# Patient Record
Sex: Male | Born: 1975 | Race: White | Hispanic: No | Marital: Single | Smoking: Current every day smoker
Health system: Southern US, Community
[De-identification: ages and names within clinical notes are randomized; demographics above are authoritative.]

## PROBLEM LIST (undated history)

## (undated) DIAGNOSIS — L0291 Cutaneous abscess, unspecified: Secondary | ICD-10-CM

## (undated) DIAGNOSIS — N200 Calculus of kidney: Secondary | ICD-10-CM

---

## 1999-03-24 ENCOUNTER — Encounter: Payer: Self-pay | Admitting: Emergency Medicine

## 1999-03-24 ENCOUNTER — Emergency Department (HOSPITAL_COMMUNITY): Admission: EM | Admit: 1999-03-24 | Discharge: 1999-03-24 | Payer: Self-pay | Admitting: Emergency Medicine

## 1999-08-12 ENCOUNTER — Emergency Department (HOSPITAL_COMMUNITY): Admission: EM | Admit: 1999-08-12 | Discharge: 1999-08-12 | Payer: Self-pay | Admitting: *Deleted

## 2001-06-27 ENCOUNTER — Inpatient Hospital Stay (HOSPITAL_COMMUNITY): Admission: AC | Admit: 2001-06-27 | Discharge: 2001-06-28 | Payer: Self-pay

## 2001-06-27 ENCOUNTER — Encounter: Payer: Self-pay | Admitting: General Surgery

## 2003-02-23 ENCOUNTER — Emergency Department (HOSPITAL_COMMUNITY): Admission: EM | Admit: 2003-02-23 | Discharge: 2003-02-23 | Payer: Self-pay | Admitting: Emergency Medicine

## 2003-03-29 ENCOUNTER — Emergency Department (HOSPITAL_COMMUNITY): Admission: EM | Admit: 2003-03-29 | Discharge: 2003-03-29 | Payer: Self-pay | Admitting: Emergency Medicine

## 2004-09-11 ENCOUNTER — Emergency Department: Payer: Self-pay | Admitting: General Practice

## 2004-09-12 ENCOUNTER — Emergency Department: Payer: Self-pay | Admitting: Emergency Medicine

## 2005-02-06 ENCOUNTER — Emergency Department: Payer: Self-pay | Admitting: Emergency Medicine

## 2006-06-08 ENCOUNTER — Emergency Department: Payer: Self-pay | Admitting: Emergency Medicine

## 2006-09-30 ENCOUNTER — Emergency Department (HOSPITAL_COMMUNITY): Admission: EM | Admit: 2006-09-30 | Discharge: 2006-09-30 | Payer: Self-pay | Admitting: Emergency Medicine

## 2006-10-12 ENCOUNTER — Emergency Department: Payer: Self-pay | Admitting: Emergency Medicine

## 2007-10-28 ENCOUNTER — Emergency Department: Payer: Self-pay | Admitting: Unknown Physician Specialty

## 2007-10-28 ENCOUNTER — Other Ambulatory Visit: Payer: Self-pay

## 2008-02-06 IMAGING — CR DG ANKLE COMPLETE 3+V*L*
3 series · 3 of 3 positions shown · non-contrast
Comparison: none

CLINICAL DATA: Lateral pain after twisting injury.    
 LEFT ANKLE - 3 VIEW:

[t ankle joint ap left]
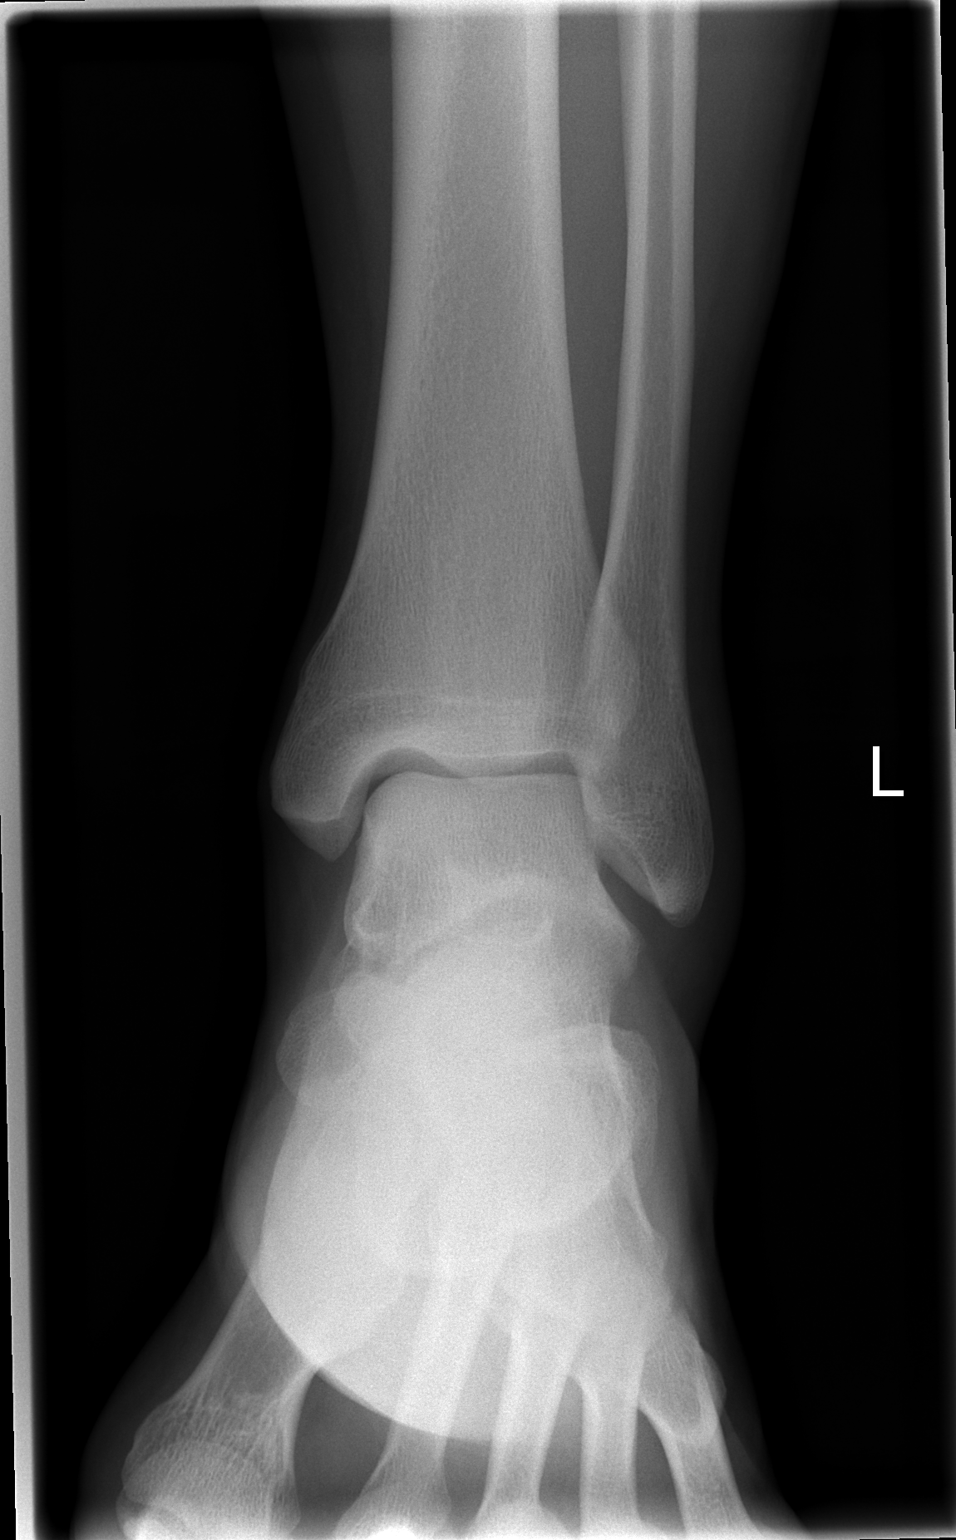

[t ankle joint oblique left]
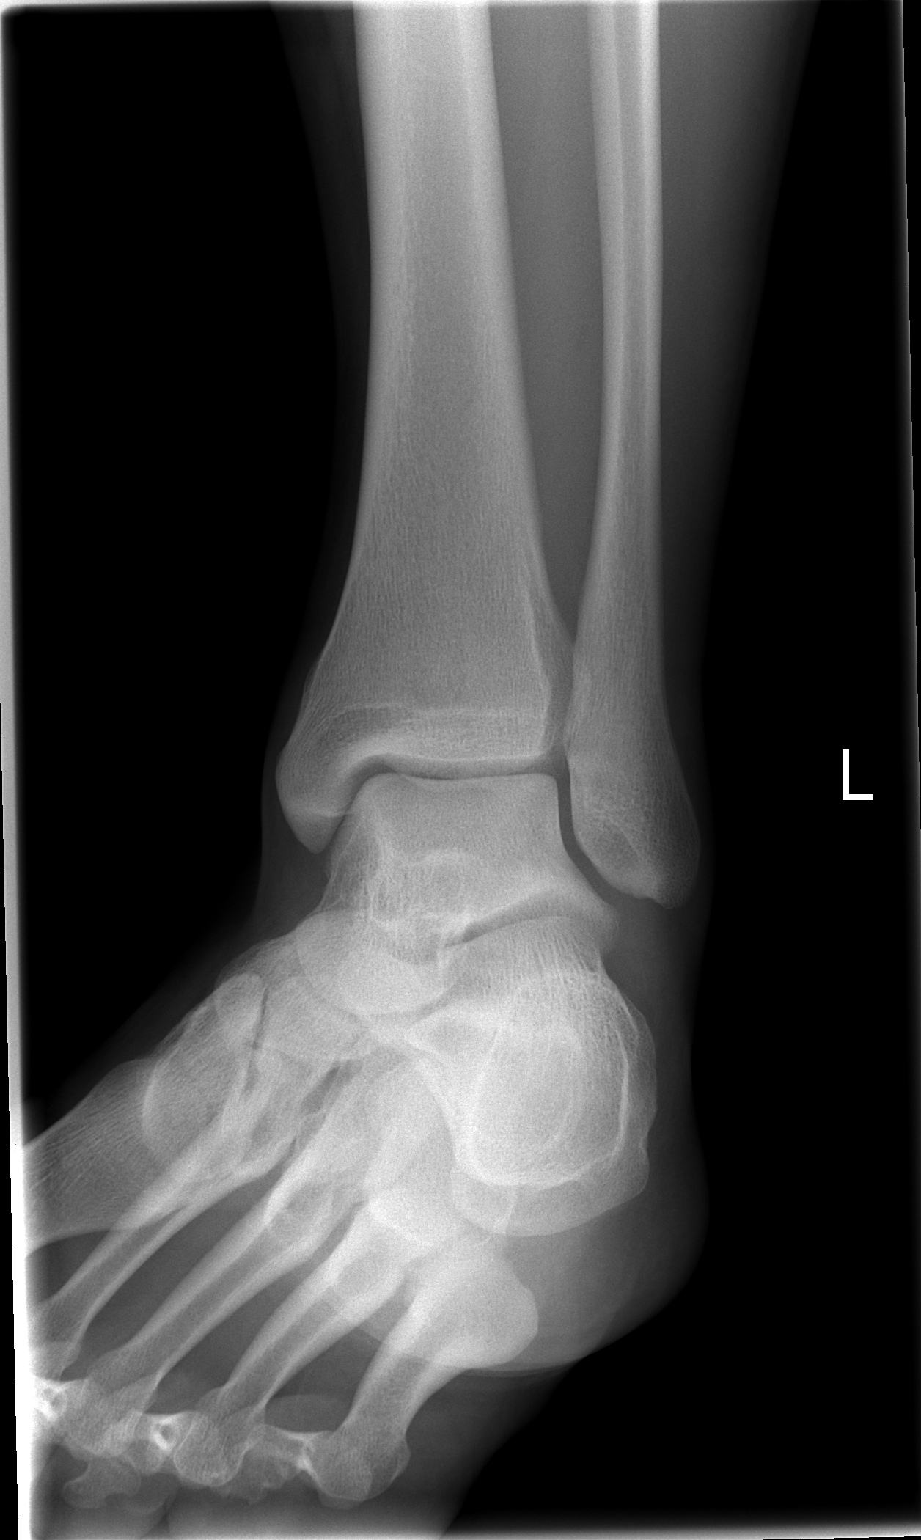

[t ankle joint lat left]
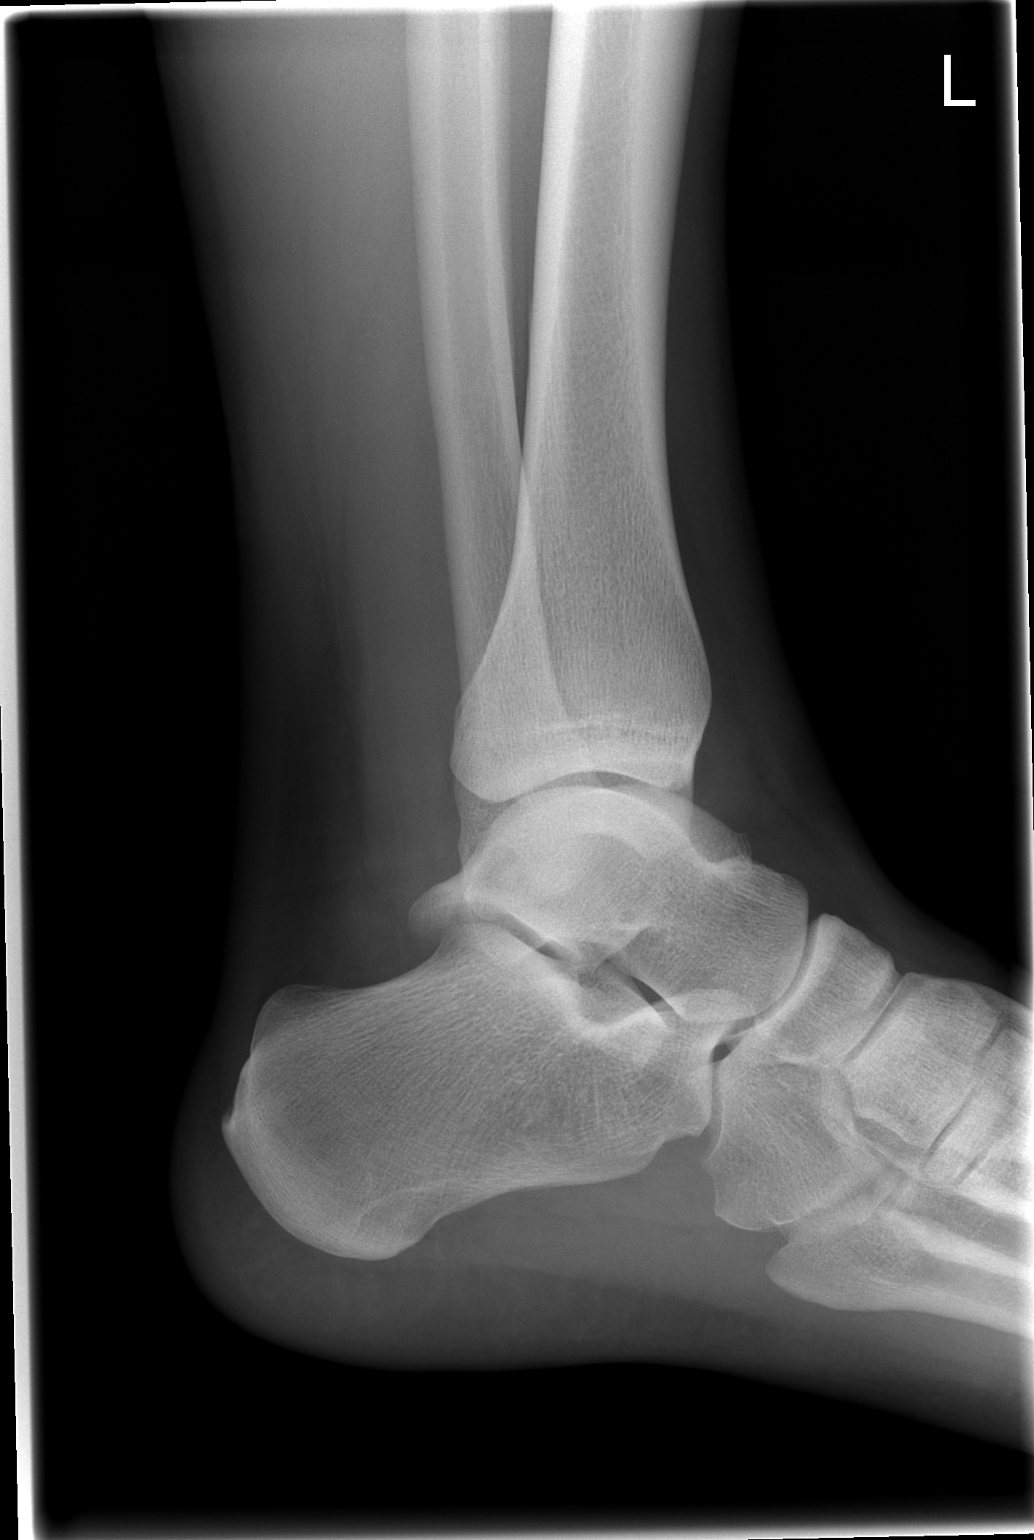

[3 of 3 positions shown; findings below may reference images not displayed]

FINDINGS: Imaged bones, joints, and soft tissues appear normal.
IMPRESSION: Negative study.

## 2008-05-15 ENCOUNTER — Emergency Department (HOSPITAL_COMMUNITY): Admission: EM | Admit: 2008-05-15 | Discharge: 2008-05-15 | Payer: Self-pay | Admitting: Emergency Medicine

## 2009-09-21 IMAGING — CR DG CHEST 2V
2 series · 2 of 2 positions shown · non-contrast
Comparison: None

CLINICAL DATA: Respiratory infection

CHEST - 2 VIEW

[w chest pa]
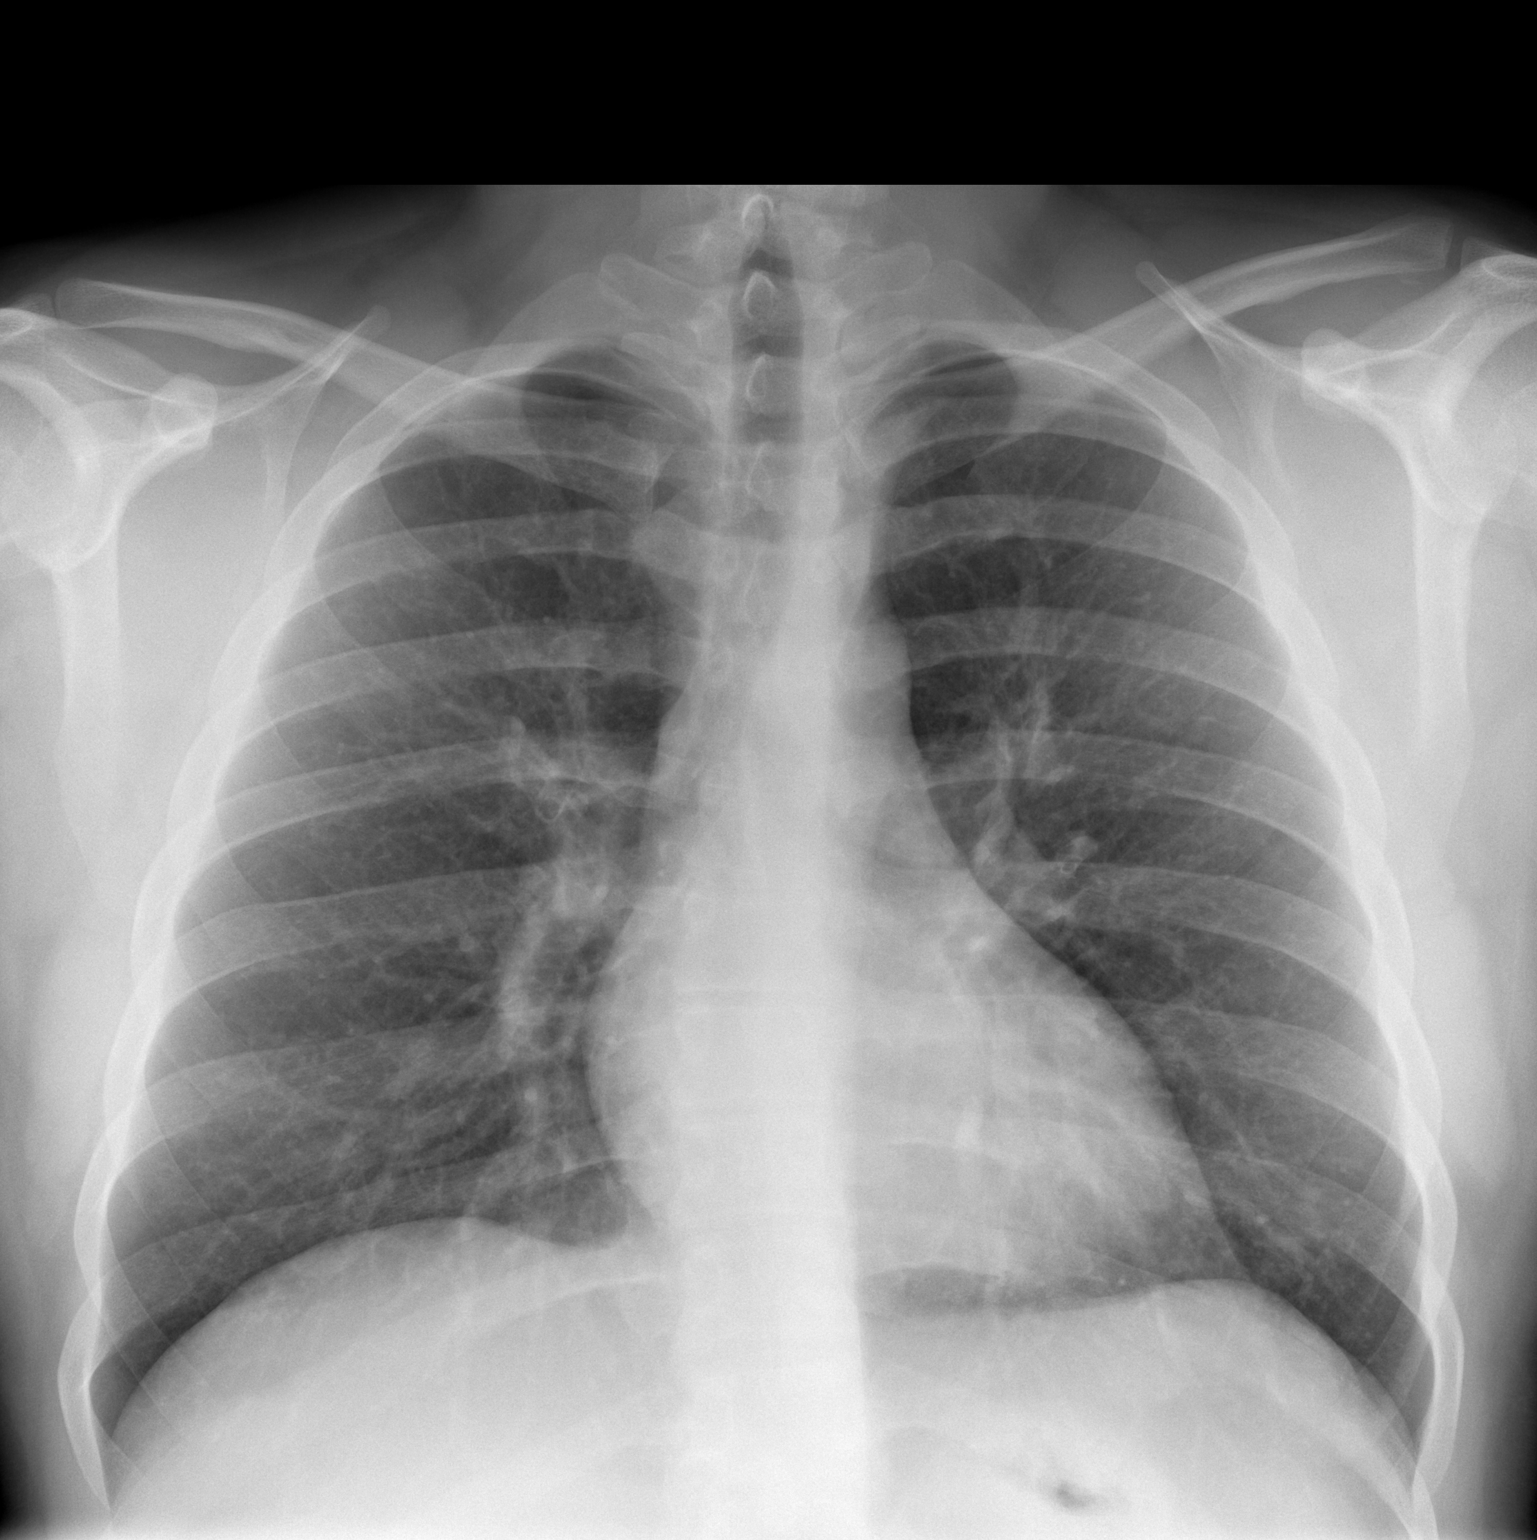

[w chest lat]
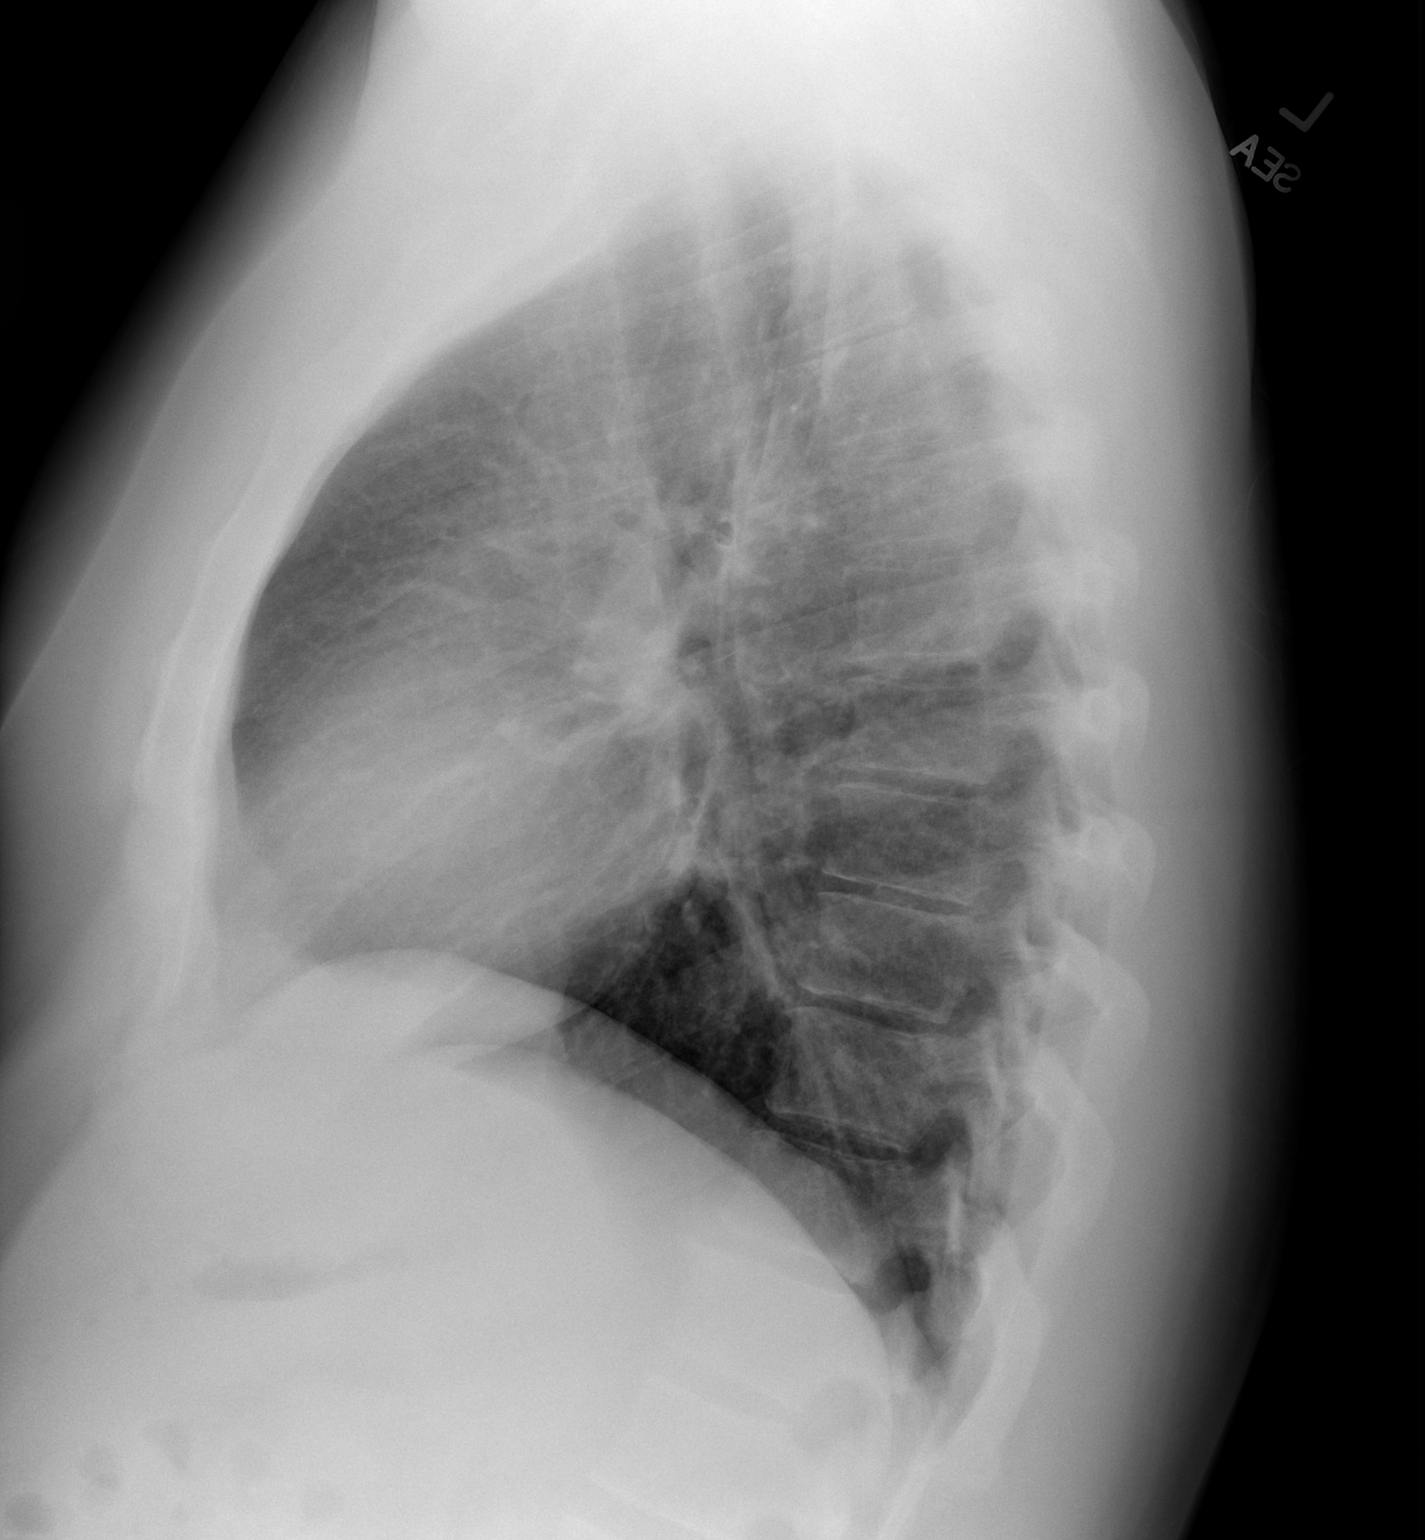

[2 of 2 positions shown; findings below may reference images not displayed]

FINDINGS: Cardiomediastinal silhouette is unremarkable.  No acute
infiltrate or pleural effusion.  No pulmonary edema.  Bony
structures are unremarkable.
IMPRESSION: No active disease.

## 2010-07-20 ENCOUNTER — Emergency Department: Payer: Self-pay | Admitting: Emergency Medicine

## 2010-08-17 ENCOUNTER — Emergency Department: Payer: Self-pay | Admitting: Emergency Medicine

## 2010-10-07 ENCOUNTER — Emergency Department: Payer: Self-pay | Admitting: Emergency Medicine

## 2010-11-28 ENCOUNTER — Emergency Department: Payer: Self-pay | Admitting: Emergency Medicine
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
## 2011-08-05 ENCOUNTER — Emergency Department: Payer: Self-pay | Admitting: Emergency Medicine

## 2011-10-18 ENCOUNTER — Emergency Department (HOSPITAL_COMMUNITY)
Admission: EM | Admit: 2011-10-18 | Discharge: 2011-10-18 | Disposition: A | Discharge status: Home / Self Care | Payer: Self-pay | Attending: Emergency Medicine | Admitting: Emergency Medicine

## 2011-10-18 ENCOUNTER — Emergency Department (HOSPITAL_COMMUNITY): Payer: Self-pay

## 2011-10-18 ENCOUNTER — Other Ambulatory Visit: Payer: Self-pay

## 2011-10-18 DIAGNOSIS — R05 Cough: Secondary | ICD-10-CM | POA: Insufficient documentation

## 2011-10-18 DIAGNOSIS — R61 Generalized hyperhidrosis: Secondary | ICD-10-CM | POA: Insufficient documentation

## 2011-10-18 DIAGNOSIS — R110 Nausea: Secondary | ICD-10-CM | POA: Insufficient documentation

## 2011-10-18 DIAGNOSIS — R0789 Other chest pain: Secondary | ICD-10-CM | POA: Insufficient documentation

## 2011-10-18 DIAGNOSIS — R059 Cough, unspecified: Secondary | ICD-10-CM | POA: Insufficient documentation

## 2011-10-18 LAB — POCT I-STAT, CHEM 8
Calcium, Ion: 1.13 mmol/L (ref 1.12–1.32)
Glucose, Bld: 90 mg/dL (ref 70–99)
HCT: 47 % (ref 39.0–52.0)
Hemoglobin: 16 g/dL (ref 13.0–17.0)
Potassium: 3.8 mEq/L (ref 3.5–5.1)

## 2011-10-18 LAB — CARDIAC PANEL(CRET KIN+CKTOT+MB+TROPI)
Relative Index: 1.1 (ref 0.0–2.5)
Total CK: 178 U/L (ref 7–232)
Troponin I: <0.3 ng/mL (ref <0.30)

## 2011-10-18 LAB — D-DIMER, QUANTITATIVE: D-Dimer, Quant: 0.26 ug/mL-FEU (ref 0.00–0.48)

## 2011-10-18 MED ORDER — SODIUM CHLORIDE 0.9 % IV SOLN
Freq: Once | INTRAVENOUS | Status: AC
  Administered 2011-10-18: 21:00:00 via INTRAVENOUS

## 2011-10-18 NOTE — Discharge Instructions (Signed)
Chest Pain (Nonspecific) Chest pain has many causes. Your pain could be caused by something serious, such as a heart attack or a blood clot in the lungs. It could also be caused by something less serious, such as a chest bruise or a virus. Follow up with your doctor. More lab tests or other studies may be needed to find the cause of your pain. Most of the time, nonspecific chest pain will improve within 2 to 3 days of rest and mild pain medicine. HOME CARE  For chest bruises, you may put ice on the sore area for 15 to 20 minutes, 3 to 4 times a day. Do this only if it makes you or your child feel better.   Put ice in a plastic bag.   Place a towel between the skin and the bag.   Rest for the next 2 to 3 days.   Go back to work if the pain improves.   See your doctor if the pain lasts longer than 1 to 2 weeks.   Only take medicine as told by your doctor.   Quit smoking if you smoke.  GET HELP RIGHT AWAY IF:   There is more pain or pain that spreads to the arm, neck, jaw, back, or belly (abdomen).   You or your child has shortness of breath.   You or your child coughs more than usual or coughs up blood.   You or your child has very bad back or belly pain, feels sick to his or her stomach (nauseous), or throws up (vomits).   You or your child has very bad weakness.   You or your child passes out (faints).   You or your child has a temperature by mouth above 102 F (38.9 C), not controlled by medicine.  MAKE SURE YOU:   Understand these instructions.   Will watch this condition.   Will get help right away if you or your child is not doing well or gets worse.  Document Released: 02/05/2008 Document Revised: 05/01/2011 Document Reviewed: 02/05/2008 [facility] Patient Information 2012 [place], [place]. Today, your cardiac evaluation is normal, her EKG is normal.  Your chest x-ray is normal as well.  You have been referred to Dr. Particia Jasper for further evaluation of your chest  discomfort

## 2011-10-18 NOTE — ED Provider Notes (Signed)
History     CSN: [id number]  Arrival date & time 10/18/11  1949   First MD Initiated Contact with Patient 10/18/11 2005      No chief complaint on file.   (Consider location/radiation/quality/duration/timing/severity/associated sxs/prior treatment) HPI Comments: Patient reports having intermittent chest pain for the last 2 months.  Every couple days or so, lasting anywhere from 10 minutes to several hours up until tonight.  He is in the morning at tonight.  A friend, who is a cardiac nurse took his vital signs and gave him 2 sublingual nitroglycerin which relieved his pain.   He reports, the pain as being directly over the left pectoralis area that is sharp, "stabbing" in nature occasionally, he will become sweaty and slightly nauseated, but not each time.  He has never become short of breath with this pain.   He has no family history of early cardiac disease, but has been told in the past that he has high cholesterol.  He does not have a primary care physician, but attends mental health for depression or he takes Celexa on a regular basis.  He is currently pain free  The history is provided by the patient.    No past medical history on file.  No past surgical history on file.  No family history on file.  History  Substance Use Topics  . Smoking status: Not on file  . Smokeless tobacco: Not on file  . Alcohol Use: Not on file      Review of Systems  Constitutional: Negative for fever and chills.  HENT: Negative for congestion.   Respiratory: Positive for cough.   Cardiovascular: Positive for chest pain. Negative for palpitations and leg swelling.  Gastrointestinal: Negative for nausea, vomiting and diarrhea.  Genitourinary: Negative for dysuria.  Skin: Negative for pallor.  Neurological: Negative for dizziness, weakness and headaches.    Allergies  Omeprazole  Home Medications   Current Outpatient Rx  Name Route Sig Dispense Refill  . CITALOPRAM HYDROBROMIDE 10 MG  PO TABS Oral Take 30 mg by mouth daily.      BP 126/71  Pulse 80  Temp(Src) 98.6 F (37 C) (Tympanic)  Resp 16  SpO2 99%  Physical Exam  Constitutional: He is oriented to person, place, and time. He appears well-developed and well-nourished.  HENT:  Head: Normocephalic.  Eyes: Pupils are equal, round, and reactive to light.  Neck: Normal range of motion.  Cardiovascular: Normal rate, regular rhythm and normal heart sounds.  Exam reveals no gallop and no friction rub.   No murmur heard. Pulmonary/Chest: Effort normal and breath sounds normal. No respiratory distress. He has no wheezes.  Abdominal: Soft. Bowel sounds are normal. He exhibits no distension. There is no tenderness.  Musculoskeletal: Normal range of motion.  Neurological: He is alert and oriented to person, place, and time.  Skin: Skin is warm and dry.  Psychiatric: He has a normal mood and affect.    ED Course  Procedures (including critical care time)   Labs Reviewed  CARDIAC PANEL(CRET KIN+CKTOT+MB+TROPI)  D-DIMER, QUANTITATIVE  POCT I-STAT, CHEM 8   Dg Chest 2 View  10/18/2011  *RADIOLOGY REPORT*  Clinical Data: Mid left sided chest pain and chest congestion  CHEST - 2 VIEW  Comparison: None.  Findings: The heart, mediastinal, and hilar contours are normal. The lungs are well-expanded and clear. Negative for pleural effusion. The bony thorax is unremarkable.  IMPRESSION: No acute cardiopulmonary disease  Original Report Authenticated By: Britta Mccreedy, M.D.  1. Chest pain, atypical     ED ECG REPORT   Date: 10/18/2011  EKG Time: 10:45 PM  Rate:75  Rhythm: normal sinus rhythm,  there are no previous tracings available for comparison  Axis: normal  Intervals:none  ST&T Change: none  Narrative Interpretation:normal            MDM  Will obtain chest x-ray, cardiac markers, electrolytes and CBC d-dimer        Arman Filter, NP 10/18/11 2025  Arman Filter, NP 10/18/11 2245

## 2011-10-18 NOTE — ED Notes (Signed)
From home.  Has had left sided chest pains x 1 month on/off, non-radiating.  Didn't increase w/palpation.  Nitro x 2 prior to EMS arrival w/reported relief.  When EMS arrived, pain returned.  EMS gave nitro x 1 more and ASA 324mg  and pt reports pain to be relieved.  EMS EKG NSR 78.  PT has had chest cold/congestion x a week.  #18 g IV to LAC by EMS.  VSS-112/p, 78, 20, 97% 2L/[place]

## 2011-10-18 NOTE — ED Notes (Signed)
Patient is resting comfortably.  Family at b/s.  NAD noted

## 2011-10-18 NOTE — ED Notes (Signed)
BJY:NW29<FA> Expected date:<BR> Expected time: 7:39 PM<BR> Means of arrival:<BR> Comments:<BR> M251 - 37yoM Chest pain x77month

## 2011-10-18 NOTE — ED Notes (Signed)
Patient has returned from  X-ray on stretcher.

## 2011-10-19 NOTE — ED Provider Notes (Signed)
Medical screening examination/treatment/procedure(s) were conducted as a shared visit with non-physician practitioner(s) and myself.  I personally evaluated the patient during the encounter  Patient is low risk for acute coronary syndrome. His workup in the emergency room were normal including chest x-ray, labs and EKG. With the chronicity of the symptoms and no clear relation to exertion feel it is reasonable for the patient to followup as an outpatient. Outpatient followup as discussed.  Celene Kras, MD 10/19/11 [phone]

## 2012-03-13 ENCOUNTER — Emergency Department: Payer: Self-pay | Admitting: Emergency Medicine

## 2012-03-13 LAB — COMPREHENSIVE METABOLIC PANEL
Anion Gap: 8 (ref 7–16)
BUN: 11 mg/dL (ref 7–18)
Bilirubin,Total: 0.4 mg/dL (ref 0.2–1.0)
Glucose: 114 mg/dL — ABNORMAL HIGH (ref 65–99)
Osmolality: 278 (ref 275–301)
SGOT(AST): 21 U/L (ref 15–37)
SGPT (ALT): 21 U/L

## 2012-03-13 LAB — URINALYSIS, COMPLETE
Bacteria: NONE SEEN
Granular Cast: 1
Ketone: NEGATIVE
Ph: 5 (ref 4.5–8.0)
Protein: NEGATIVE
RBC,UR: 1 /HPF (ref 0–5)
Specific Gravity: 1.027 (ref 1.003–1.030)
Squamous Epithelial: <1

## 2012-03-13 LAB — CBC
HCT: 46.4 % (ref 40.0–52.0)
HGB: 15.6 g/dL (ref 13.0–18.0)
MCHC: 33.7 g/dL (ref 32.0–36.0)
Platelet: 232 10*3/uL (ref 150–440)
WBC: 10.8 10*3/uL — ABNORMAL HIGH (ref 3.8–10.6)

## 2012-08-16 ENCOUNTER — Emergency Department: Payer: Self-pay | Admitting: Internal Medicine

## 2012-08-16 LAB — RAPID INFLUENZA A&B ANTIGENS

## 2013-02-01 ENCOUNTER — Emergency Department: Payer: Self-pay | Admitting: Emergency Medicine

## 2013-02-01 LAB — BASIC METABOLIC PANEL
Anion Gap: 4 — ABNORMAL LOW (ref 7–16)
BUN: 14 mg/dL (ref 7–18)
Co2: 28 mmol/L (ref 21–32)
EGFR (African American): >60
EGFR (Non-African Amer.): >60
Glucose: 77 mg/dL (ref 65–99)
Osmolality: 277 (ref 275–301)
Sodium: 139 mmol/L (ref 136–145)

## 2013-02-01 LAB — CBC
HCT: 44.6 % (ref 40.0–52.0)
HGB: 15.2 g/dL (ref 13.0–18.0)
MCV: 89 fL (ref 80–100)
Platelet: 205 10*3/uL (ref 150–440)
WBC: 10.1 10*3/uL (ref 3.8–10.6)

## 2013-02-01 LAB — CK TOTAL AND CKMB (NOT AT ARMC)
CK, Total: 369 U/L — ABNORMAL HIGH (ref 35–232)
CK-MB: 2.1 ng/mL (ref 0.5–3.6)

## 2013-02-01 LAB — TROPONIN I: Troponin-I: 0.02 ng/mL

## 2013-02-23 ENCOUNTER — Emergency Department: Payer: Self-pay | Admitting: Emergency Medicine

## 2013-02-23 LAB — BASIC METABOLIC PANEL
Anion Gap: 3 — ABNORMAL LOW (ref 7–16)
BUN: 11 mg/dL (ref 7–18)
Calcium, Total: 8.7 mg/dL (ref 8.5–10.1)
Chloride: 105 mmol/L (ref 98–107)
Creatinine: 1.33 mg/dL — ABNORMAL HIGH (ref 0.60–1.30)
EGFR (African American): >60
EGFR (Non-African Amer.): >60
Potassium: 4.4 mmol/L (ref 3.5–5.1)

## 2013-02-23 LAB — CBC WITH DIFFERENTIAL/PLATELET
Basophil #: 0 10*3/uL (ref 0.0–0.1)
Basophil %: 0.4 %
Eosinophil #: 0.4 10*3/uL (ref 0.0–0.7)
HCT: 44.7 % (ref 40.0–52.0)
HGB: 15.3 g/dL (ref 13.0–18.0)
MCV: 90 fL (ref 80–100)
Monocyte #: 0.7 x10 3/mm (ref 0.2–1.0)
Neutrophil %: 76.4 %
Platelet: 234 10*3/uL (ref 150–440)
RBC: 4.98 10*6/uL (ref 4.40–5.90)
RDW: 13.7 % (ref 11.5–14.5)

## 2013-02-26 ENCOUNTER — Emergency Department: Payer: Self-pay | Admitting: Unknown Physician Specialty

## 2013-02-26 ENCOUNTER — Emergency Department: Payer: Self-pay | Admitting: Emergency Medicine

## 2013-02-26 LAB — COMPREHENSIVE METABOLIC PANEL
Albumin: 3.5 g/dL (ref 3.4–5.0)
Alkaline Phosphatase: 86 U/L (ref 50–136)
BUN: 13 mg/dL (ref 7–18)
Calcium, Total: 8.6 mg/dL (ref 8.5–10.1)
Chloride: 104 mmol/L (ref 98–107)
Co2: 29 mmol/L (ref 21–32)
Creatinine: 1.24 mg/dL (ref 0.60–1.30)
EGFR (African American): >60
EGFR (Non-African Amer.): >60
Glucose: 83 mg/dL (ref 65–99)
Potassium: 4.1 mmol/L (ref 3.5–5.1)
SGOT(AST): 18 U/L (ref 15–37)
SGPT (ALT): 32 U/L (ref 12–78)
Sodium: 137 mmol/L (ref 136–145)
Total Protein: 7 g/dL (ref 6.4–8.2)

## 2013-02-26 LAB — CBC WITH DIFFERENTIAL/PLATELET
Basophil %: 0.9 %
Eosinophil #: 0.3 10*3/uL (ref 0.0–0.7)
Eosinophil %: 3.2 %
HCT: 44.7 % (ref 40.0–52.0)
HGB: 15.3 g/dL (ref 13.0–18.0)
Lymphocyte #: 2.7 10*3/uL (ref 1.0–3.6)
Lymphocyte %: 25.1 %
MCH: 30.7 pg (ref 26.0–34.0)
MCHC: 34.1 g/dL (ref 32.0–36.0)
MCV: 90 fL (ref 80–100)
Monocyte #: 1.1 x10 3/mm — ABNORMAL HIGH (ref 0.2–1.0)
Neutrophil #: 6.4 10*3/uL (ref 1.4–6.5)
Neutrophil %: 60.2 %
Platelet: 229 10*3/uL (ref 150–440)
RBC: 4.97 10*6/uL (ref 4.40–5.90)
RDW: 13.7 % (ref 11.5–14.5)

## 2013-11-28 ENCOUNTER — Emergency Department: Payer: Self-pay | Admitting: Emergency Medicine

## 2013-11-28 LAB — COMPREHENSIVE METABOLIC PANEL
Albumin: 3.6 g/dL (ref 3.4–5.0)
Alkaline Phosphatase: 91 U/L
Anion Gap: 5 — ABNORMAL LOW (ref 7–16)
BUN: 9 mg/dL (ref 7–18)
Bilirubin,Total: 0.3 mg/dL (ref 0.2–1.0)
Calcium, Total: 8.9 mg/dL (ref 8.5–10.1)
Chloride: 107 mmol/L (ref 98–107)
Co2: 28 mmol/L (ref 21–32)
Creatinine: 0.99 mg/dL (ref 0.60–1.30)
Glucose: 88 mg/dL (ref 65–99)
Osmolality: 278 (ref 275–301)
Potassium: 3.8 mmol/L (ref 3.5–5.1)
SGOT(AST): 14 U/L — ABNORMAL LOW (ref 15–37)
SGPT (ALT): 19 U/L (ref 12–78)
Sodium: 140 mmol/L (ref 136–145)
Total Protein: 7.2 g/dL (ref 6.4–8.2)

## 2013-11-28 LAB — CBC WITH DIFFERENTIAL/PLATELET
BASOS ABS: 0 10*3/uL (ref 0.0–0.1)
Basophil %: 0.2 %
EOS PCT: 7.2 %
Eosinophil #: 0.8 10*3/uL — ABNORMAL HIGH (ref 0.0–0.7)
HCT: 45.2 % (ref 40.0–52.0)
HGB: 15.1 g/dL (ref 13.0–18.0)
Lymphocyte #: 3.5 10*3/uL (ref 1.0–3.6)
Lymphocyte %: 33.5 %
MCH: 30.7 pg (ref 26.0–34.0)
MCHC: 33.4 g/dL (ref 32.0–36.0)
MCV: 92 fL (ref 80–100)
MONOS PCT: 7.3 %
Monocyte #: 0.8 x10 3/mm (ref 0.2–1.0)
NEUTROS ABS: 5.4 10*3/uL (ref 1.4–6.5)
Neutrophil %: 51.8 %
PLATELETS: 225 10*3/uL (ref 150–440)
RBC: 4.92 10*6/uL (ref 4.40–5.90)
RDW: 13.3 % (ref 11.5–14.5)
WBC: 10.5 10*3/uL (ref 3.8–10.6)

## 2013-11-28 LAB — URINALYSIS, COMPLETE
BACTERIA: NONE SEEN
BLOOD: NEGATIVE
Bilirubin,UR: NEGATIVE
GLUCOSE, UR: NEGATIVE mg/dL (ref 0–75)
KETONE: NEGATIVE
LEUKOCYTE ESTERASE: NEGATIVE
Nitrite: NEGATIVE
Ph: 5 (ref 4.5–8.0)
Protein: NEGATIVE
RBC,UR: <1 /HPF (ref 0–5)
Specific Gravity: 1.018 (ref 1.003–1.030)
Squamous Epithelial: <1
WBC UR: 1 /HPF (ref 0–5)

## 2014-05-16 ENCOUNTER — Emergency Department: Payer: Self-pay | Admitting: Emergency Medicine

## 2014-12-20 ENCOUNTER — Emergency Department
Admit: 2014-12-20 | Disposition: A | Discharge status: Home / Self Care | Payer: Self-pay | Admitting: Emergency Medicine

## 2015-01-26 ENCOUNTER — Emergency Department
Admission: EM | Admit: 2015-01-26 | Discharge: 2015-01-26 | Disposition: A | Discharge status: Home / Self Care | Payer: Self-pay | Attending: Emergency Medicine | Admitting: Emergency Medicine

## 2015-01-26 ENCOUNTER — Encounter: Payer: Self-pay | Admitting: Medical Oncology

## 2015-01-26 DIAGNOSIS — L259 Unspecified contact dermatitis, unspecified cause: Secondary | ICD-10-CM | POA: Insufficient documentation

## 2015-01-26 DIAGNOSIS — H6983 Other specified disorders of Eustachian tube, bilateral: Secondary | ICD-10-CM | POA: Insufficient documentation

## 2015-01-26 DIAGNOSIS — Z79899 Other long term (current) drug therapy: Secondary | ICD-10-CM | POA: Insufficient documentation

## 2015-01-26 DIAGNOSIS — Z720 Tobacco use: Secondary | ICD-10-CM | POA: Insufficient documentation

## 2015-01-26 DIAGNOSIS — L309 Dermatitis, unspecified: Secondary | ICD-10-CM

## 2015-01-26 MED ORDER — CETIRIZINE HCL 10 MG PO CAPS
10.0000 mg | ORAL_CAPSULE | Freq: Every day | ORAL | Status: DC
Start: 2015-01-26 — End: 2015-05-02

## 2015-01-26 MED ORDER — TRIAMCINOLONE ACETONIDE 0.5 % EX OINT
1.0000 | TOPICAL_OINTMENT | Freq: Two times a day (BID) | CUTANEOUS | Status: DC
Start: 2015-01-26 — End: 2015-05-02

## 2015-01-26 MED ORDER — FLUTICASONE FUROATE 27.5 MCG/SPRAY NA SUSP: 2.0000 | Freq: Every day | NASAL | Status: DC

## 2015-01-26 NOTE — ED Provider Notes (Signed)
[facility] Emergency Department Provider Note  ____________________________________________  Time seen: Approximately 1:23 PM  I have reviewed the triage vital signs and the nursing notes.   HISTORY  Chief Complaint Rash and Otalgia    HPI Johnny Russell is a 39 y.o. male who presents with a two-month history of rash. Rash started on the left axilla, spread downleft thorax, scattered over mid and lower back, and has now spread to the right axilla. He denies other areas of the rash. No one else in his home has any similar symptoms. He has not been out of town or slept in any other bed than his own. He denies change in hygiene products or soap powder. He was seen about a month ago in a walk-in clinic and was given a prescription for antibiotics for presumably cellulitis. He states the antibiotics did not help the rash go away and did not help with the itch. He also complains of bilateral ear pain and a feeling of fullness. That has been going on for about 7 days.   History reviewed. No pertinent past medical history.  There are no active problems to display for this patient.   History reviewed. No pertinent past surgical history.  Current Outpatient Rx  Name  Route  Sig  Dispense  Refill  . Cetirizine HCl 10 MG CAPS   Oral   Take 1 capsule (10 mg total) by mouth daily.   30 capsule   3   . citalopram (CELEXA) 10 MG tablet   Oral   Take 30 mg by mouth daily.         . fluticasone (VERAMYST) 27.5 MCG/SPRAY nasal spray   Nasal   Place 2 sprays into the nose daily.   10 g   2   . triamcinolone ointment (KENALOG) 0.5 %   Topical   Apply 1 application topically 2 (two) times daily.   30 g   0     Allergies Omeprazole and Omni-pac  No family history on file.  Social History History  Substance Use Topics  . Smoking status: Current Every Day Smoker  . Smokeless tobacco: Not on file  . Alcohol Use: Yes     Comment: occ    Review  of Systems Constitutional: No fever/chills Eyes: No visual changes. ENT: No sore throat. Bilateral ear pain Cardiovascular: Denies chest pain. Respiratory: Denies shortness of breath. Gastrointestinal: No abdominal pain.  No nausea, no vomiting.  No diarrhea.  No constipation. Genitourinary: Negative for dysuria. Musculoskeletal: Negative for back pain. Skin: Positive for rash. Neurological: Negative for headaches, focal weakness or numbness.  10-point ROS otherwise negative.  ____________________________________________   PHYSICAL EXAM:  VITAL SIGNS: ED Triage Vitals  Enc Vitals Group     BP 01/26/15 1143 128/92 mmHg     Pulse Rate 01/26/15 1143 87     Resp 01/26/15 1143 18     Temp 01/26/15 1143 98 F (36.7 C)     Temp Source 01/26/15 1143 Oral     SpO2 01/26/15 1143 98 %     Weight 01/26/15 1143 240 lb (108.863 kg)     Height 01/26/15 1143  (1.778 m)     Head Cir --      Peak Flow --      Pain Score 01/26/15 1144 3     Pain Loc --      Pain Edu? --      Excl. in GC? --     Constitutional:  Alert and oriented. Well appearing and in no acute distress. Eyes: Conjunctivae are normal. PERRL. EOMI. Head: Atraumatic. Nose: No congestion/rhinnorhea. Mouth/Throat: Mucous membranes are moist.  Oropharynx non-erythematous. Ears: serous OM bilateral without erythema Neck: No stridor.   Cardiovascular: Normal rate, regular rhythm. Grossly normal heart sounds.  Good peripheral circulation. Respiratory: Normal respiratory effort.  No retractions. Lungs CTAB. Gastrointestinal: Soft and nontender. No distention. No abdominal bruits. No CVA tenderness. Musculoskeletal: No lower extremity tenderness nor edema.  No joint effusions. Neurologic:  Normal speech and language. No gross focal neurologic deficits are appreciated. Speech is normal. No gait instability. Skin:  Skin is warm, dry and intact. Fine, papular rash present  bilateral axilla. Length of illness and excoriation  make the diagnosis difficult. Psychiatric: Mood and affect are normal. Speech and behavior are normal.  ____________________________________________   LABS (all labs ordered are listed, but only abnormal results are displayed)  Labs Reviewed - No data to display ____________________________________________  EKG   ____________________________________________  RADIOLOGY   ____________________________________________   PROCEDURES  Procedure(s) performed: None  Critical Care performed: No  ____________________________________________   INITIAL IMPRESSION / ASSESSMENT AND PLAN / ED COURSE  Pertinent labs & imaging results that were available during my care of the patient were reviewed by me and considered in my medical decision making (see chart for details).  Patient was advised that he would need to see a dermatologist if the steroid cream does not help the rash. He was advised to follow up with primary care provider of his choice for ear pain/fullness in the antihistamines and nasal spray did not help. He was advised to return to the emergency department for symptoms that change or worsen if he is unable schedule an appointment. ____________________________________________   FINAL CLINICAL IMPRESSION(S) / ED DIAGNOSES  Final diagnoses:  Dermatitis  Eustachian tube dysfunction, bilateral      Chinita PesterCari B Triplett, FNP 01/26/15 1332  Patient was seen by the mid-level I was in the ER available for consult during this time  Arnaldo NatalPaul F Malinda, MD 01/26/15 1556

## 2015-01-26 NOTE — Discharge Instructions (Signed)

## 2015-01-26 NOTE — ED Notes (Addendum)
Pt ambulatory to triage with reports of rash to arms that is itchy. Also reports earache.

## 2015-05-02 ENCOUNTER — Emergency Department
Admission: EM | Admit: 2015-05-02 | Discharge: 2015-05-02 | Disposition: A | Discharge status: Home / Self Care | Payer: Self-pay | Attending: Emergency Medicine | Admitting: Emergency Medicine

## 2015-05-02 ENCOUNTER — Encounter: Payer: Self-pay | Admitting: Emergency Medicine

## 2015-05-02 DIAGNOSIS — Z720 Tobacco use: Secondary | ICD-10-CM | POA: Insufficient documentation

## 2015-05-02 DIAGNOSIS — Z79899 Other long term (current) drug therapy: Secondary | ICD-10-CM | POA: Insufficient documentation

## 2015-05-02 DIAGNOSIS — L259 Unspecified contact dermatitis, unspecified cause: Secondary | ICD-10-CM | POA: Insufficient documentation

## 2015-05-02 MED ORDER — PREDNISONE 10 MG PO TABS: ORAL_TABLET | ORAL | Status: DC

## 2015-05-02 MED ORDER — CETIRIZINE HCL 10 MG PO CAPS
10.0000 mg | ORAL_CAPSULE | Freq: Every day | ORAL | Status: DC
Start: 2015-05-02 — End: 2019-08-03

## 2015-05-02 MED ORDER — HYDROXYZINE HCL 25 MG PO TABS: 25.0000 mg | ORAL_TABLET | Freq: Three times a day (TID) | ORAL | Status: DC | PRN

## 2015-05-02 NOTE — Discharge Instructions (Signed)

## 2015-05-02 NOTE — ED Notes (Signed)
Patient to ED with c/o itchy rash that he has had off and on for months. Reports being seen here for same but with little relief.

## 2015-05-02 NOTE — ED Provider Notes (Signed)
CSN: [id number]     Arrival date & time 05/02/15  0947 History   First MD Initiated Contact with Patient 05/02/15 1012     Chief Complaint  Patient presents with  . Rash     HPI Comments: 39 year old male presents today complaining of diffuse rash that has been intermittent for the past several months. He was seen earlier this year at the ER for the same and was prescribed zyrtec without relief. He has changed all of his detergents, soaps and lotions hoping the rash would subside and it has not. Has not traveled anywhere, no new mattress or bedding.   Patient is a 39 y.o. male presenting with rash. The history is provided by the patient.  Rash Location:  Full body Quality: itchiness, redness and swelling   Severity:  Moderate Onset quality:  Gradual Timing:  Intermittent Progression:  Unchanged Chronicity:  Recurrent Context: not chemical exposure, not exposure to similar rash, not food, not insect bite/sting, not medications, not new detergent/soap and not sick contacts   Relieved by:  None tried Worsened by:  Contact Ineffective treatments:  Anti-itch cream, topical steroids and antihistamines Associated symptoms: no fever, no joint pain, no periorbital edema, no throat swelling and no tongue swelling     History reviewed. No pertinent past medical history. History reviewed. No pertinent past surgical history. History reviewed. No pertinent family history. Social History  Substance Use Topics  . Smoking status: Current Every Day Smoker  . Smokeless tobacco: None  . Alcohol Use: Yes     Comment: occ    Review of Systems  Constitutional: Negative for fever.  Musculoskeletal: Negative for arthralgias.  Skin: Positive for rash.  All other systems reviewed and are negative.     Allergies  Omeprazole and Omni-pac  Home Medications   Prior to Admission medications   Medication Sig Start Date End Date Taking? Authorizing Provider  Cetirizine HCl (ZYRTEC ALLERGY) 10 MG  CAPS Take 1 capsule (10 mg total) by mouth daily. 05/02/15   Luvenia Redden, PA-C  citalopram (CELEXA) 10 MG tablet Take 30 mg by mouth daily.    Historical Provider, MD  fluticasone (VERAMYST) 27.5 MCG/SPRAY nasal spray Place 2 sprays into the nose daily. 01/26/15   Chinita Pester, FNP  hydrOXYzine (ATARAX/VISTARIL) 25 MG tablet Take 1 tablet (25 mg total) by mouth 3 (three) times daily as needed for itching. 05/02/15   Luvenia Redden, PA-C  predniSONE (DELTASONE) 10 MG tablet Taper: 6,5,4,3,2,1 05/02/15   Wilber Oliphant V, PA-C   BP 144/97 mmHg  Pulse 71  Temp(Src) 98.5 F (36.9 C) (Oral)  Resp 20  Ht  (1.778 m)  Wt 240 lb (108.863 kg)  BMI 34.44 kg/m2  SpO2 96% Physical Exam  Constitutional: He is oriented to person, place, and time. He appears well-developed and well-nourished.  HENT:  Head: Normocephalic and atraumatic.  Musculoskeletal: Normal range of motion.  Neurological: He is alert and oriented to person, place, and time.  Skin: Skin is warm and dry. Rash noted. Rash is maculopapular. There is erythema.  Psychiatric: He has a normal mood and affect. His behavior is normal. Judgment and thought content normal.  Nursing note and vitals reviewed.   ED Course  Procedures (including critical care time) Labs Review Labs Reviewed - No data to display  Imaging Review No results found. I have personally reviewed and evaluated these images and lab results as part of my medical decision-making.   EKG Interpretation None  MDM  Prednisone 6 day taper Atarax q6hours PRN itching Start taking zyrtec daily to control rash and associated itching.  Final diagnoses:  Contact dermatitis       Luvenia Redden, PA-C 05/02/15 1047  Wilber Oliphant V, PA-C 05/02/15 [address] V, PA-C 05/02/15 1048  Darien Ramus, MD 05/02/15 [phone]

## 2016-10-04 ENCOUNTER — Emergency Department
Admission: EM | Admit: 2016-10-04 | Discharge: 2016-10-04 | Disposition: A | Discharge status: Home / Self Care | Payer: Self-pay | Attending: Emergency Medicine | Admitting: Emergency Medicine

## 2016-10-04 ENCOUNTER — Encounter: Payer: Self-pay | Admitting: Medical Oncology

## 2016-10-04 DIAGNOSIS — X58XXXA Exposure to other specified factors, initial encounter: Secondary | ICD-10-CM | POA: Insufficient documentation

## 2016-10-04 DIAGNOSIS — S29019A Strain of muscle and tendon of unspecified wall of thorax, initial encounter: Secondary | ICD-10-CM

## 2016-10-04 DIAGNOSIS — S29012A Strain of muscle and tendon of back wall of thorax, initial encounter: Secondary | ICD-10-CM | POA: Insufficient documentation

## 2016-10-04 DIAGNOSIS — Y999 Unspecified external cause status: Secondary | ICD-10-CM | POA: Insufficient documentation

## 2016-10-04 DIAGNOSIS — Y929 Unspecified place or not applicable: Secondary | ICD-10-CM | POA: Insufficient documentation

## 2016-10-04 DIAGNOSIS — F17210 Nicotine dependence, cigarettes, uncomplicated: Secondary | ICD-10-CM | POA: Insufficient documentation

## 2016-10-04 DIAGNOSIS — Y939 Activity, unspecified: Secondary | ICD-10-CM | POA: Insufficient documentation

## 2016-10-04 DIAGNOSIS — M62830 Muscle spasm of back: Secondary | ICD-10-CM

## 2016-10-04 MED ORDER — CYCLOBENZAPRINE HCL 10 MG PO TABS: 10.0000 mg | ORAL_TABLET | Freq: Three times a day (TID) | ORAL | 0 refills | Status: DC | PRN

## 2016-10-04 MED ORDER — ORPHENADRINE CITRATE 30 MG/ML IJ SOLN
60.0000 mg | Freq: Two times a day (BID) | INTRAMUSCULAR | Status: DC
  Administered 2016-10-04: 60 mg via INTRAMUSCULAR
  Filled 2016-10-04: qty 2

## 2016-10-04 MED ORDER — KETOROLAC TROMETHAMINE 30 MG/ML IJ SOLN
30.0000 mg | Freq: Once | INTRAMUSCULAR | Status: AC
  Administered 2016-10-04: 30 mg via INTRAMUSCULAR
  Filled 2016-10-04: qty 1

## 2016-10-04 MED ORDER — NAPROXEN 500 MG PO TABS: 500.0000 mg | ORAL_TABLET | Freq: Two times a day (BID) | ORAL | 0 refills | Status: DC

## 2016-10-04 NOTE — ED Provider Notes (Signed)
[facility] Emergency Department Provider Note  ____________________________________________  Time seen: Approximately 3:03 PM  I have reviewed the triage vital signs and the nursing notes.   HISTORY  Chief Complaint Back Pain    HPI Johnny Russell is a 41 y.o. male , NAD, presents to the emergency department for evaluation of middle back pain. Patient states he has had issues off and on for many years in regards to back pain. States he typically will get increasing back pain when he completes more physical activity. States he works in [employer] as been lifting and moving heavy items more than usual over the last few days. Has had pain about the mid back that has not been improving with over-the-counter medications. States his muscles do feel tight. Can feel short of breath when the pain occurs. States he has not been able to stand upright due to the pain. Denies any injuries, traumas or falls to exacerbate pain. Has had no numbness, weakness or tingling. Has not noted any rashes, redness or swelling.   History reviewed. No pertinent past medical history.  There are no active problems to display for this patient.   History reviewed. No pertinent surgical history.  Prior to Admission medications   Medication Sig Start Date End Date Taking? Authorizing Provider  Cetirizine HCl (ZYRTEC ALLERGY) 10 MG CAPS Take 1 capsule (10 mg total) by mouth daily. 05/02/15   Christella ScheuermannEmma Lawrence V, PA-C  citalopram (CELEXA) 10 MG tablet Take 30 mg by mouth daily.    Historical Provider, MD  cyclobenzaprine (FLEXERIL) 10 MG tablet Take 1 tablet (10 mg total) by mouth 3 (three) times daily as needed for muscle spasms. 10/04/16   Braeden Dolinski L Sherian Valenza, PA-C  fluticasone (VERAMYST) 27.5 MCG/SPRAY nasal spray Place 2 sprays into the nose daily. 01/26/15   Chinita Pesterari B Triplett, FNP  hydrOXYzine (ATARAX/VISTARIL) 25 MG tablet Take 1 tablet (25 mg total) by mouth 3 (three) times daily as needed for  itching. 05/02/15   Christella ScheuermannEmma Lawrence V, PA-C  naproxen (NAPROSYN) 500 MG tablet Take 1 tablet (500 mg total) by mouth 2 (two) times daily with a meal. 10/04/16   Yulitza Shorts L Kiri Hinderliter, PA-C  predniSONE (DELTASONE) 10 MG tablet Taper: 6,5,4,3,2,1 05/02/15   Christella ScheuermannEmma Lawrence V, PA-C    Allergies Omeprazole and Omni-pac [cefdinir]  No family history on file.  Social History Social History  Substance Use Topics  . Smoking status: Current Every Day Smoker  . Smokeless tobacco: Not on file  . Alcohol use Yes     Comment: occ     Review of Systems  Constitutional: No fatigue Eyes: No visual changes.  Cardiovascular: No chest pain. Respiratory: No shortness of breath.  Musculoskeletal: Positive for back pain.  Skin: Negative for rash or redness, swelling, bruising, skin sores. Neurological: Negative for numbness, weakness, tingling. 10-point ROS otherwise negative.  ____________________________________________   PHYSICAL EXAM:  VITAL SIGNS: ED Triage Vitals [10/04/16 1412]  Enc Vitals Group     BP      Pulse      Resp      Temp      Temp src      SpO2      Weight 230 lb (104.3 kg)     Height 5\' 10"  (1.778 m)     Head Circumference      Peak Flow      Pain Score 10     Pain Loc      Pain Edu?  Excl. in GC?      Constitutional: Alert and oriented. Well appearing and in no acute distress. Eyes: Conjunctivae are normal. Head: Atraumatic. Cardiovascular: Normal rate, regular rhythm. Normal S1 and S2.  Good peripheral circulation. Respiratory: Normal respiratory effort without tachypnea or retractions. Lungs CTABBreath sounds noted in all lung fields. No wheeze, rhonchi, rales. Musculoskeletal: Tenderness to palpation about the mid thoracic paraspinal region with muscle spasm appreciated. No midline spinal tenderness about the thoracic or lumbar spine. No step-offs or deformities. No lower extremity tenderness nor edema.  No joint effusions. Full range of motion of bilateral upper and  lower extremities without pain or difficulty. No SI joint tenderness bilaterally. Negative bilateral straight leg raise. Neurologic:  Normal speech and language. No gross focal neurologic deficits are appreciated.  Skin:  Skin is warm, dry and intact. No rash, redness, swelling, bruising, skin sores noted. Psychiatric: Mood and affect are normal. Speech and behavior are normal. Patient exhibits appropriate insight and judgement.   ____________________________________________   LABS  None ____________________________________________  EKG  None ____________________________________________  RADIOLOGY  None ____________________________________________    PROCEDURES  Procedure(s) performed: None   Procedures   Medications  orphenadrine (NORFLEX) injection 60 mg (60 mg Intramuscular Given 10/04/16 1538)  ketorolac (TORADOL) 30 MG/ML injection 30 mg (30 mg Intramuscular Given 10/04/16 1537)     ____________________________________________   INITIAL IMPRESSION / ASSESSMENT AND PLAN / ED COURSE  Pertinent labs & imaging results that were available during my care of the patient were reviewed by me and considered in my medical decision making (see chart for details).     Patient's diagnosis is consistent with Thoracic strain with muscle spasm of the back. She was given IM Toradol and Norflex and tolerated well without side effects. Patient will be discharged home with prescriptions for Toradol and Flexeril to take as directed. Patient is to follow up with [facility] if symptoms persist past this treatment course. Patient is given ED precautions to return to the ED for any worsening or new symptoms.    ____________________________________________  FINAL CLINICAL IMPRESSION(S) / ED DIAGNOSES  Final diagnoses:  Thoracic myofascial strain, initial encounter  Muscle spasm of back      NEW MEDICATIONS STARTED DURING THIS VISIT:  New Prescriptions    CYCLOBENZAPRINE (FLEXERIL) 10 MG TABLET    Take 1 tablet (10 mg total) by mouth 3 (three) times daily as needed for muscle spasms.   NAPROXEN (NAPROSYN) 500 MG TABLET    Take 1 tablet (500 mg total) by mouth 2 (two) times daily with a meal.         Hope Pigeon, PA-C 10/04/16 1626    Jeanmarie Plant, MD 10/05/16 1421

## 2016-10-04 NOTE — ED Triage Notes (Signed)
Pt reports he began having mid back pain yesterday, states that since he was 17 he has always had intermittent pain to this area, pain worsens with movement, has been doing lots of lifting recently.

## 2016-10-04 NOTE — ED Notes (Signed)
Acute on chronic back pain 10/10. Equal grips, denies extremity numbness or weakness. Denies changes in bowel or bladder.

## 2017-02-20 ENCOUNTER — Encounter: Payer: Self-pay | Admitting: Emergency Medicine

## 2017-02-20 ENCOUNTER — Emergency Department
Admission: EM | Admit: 2017-02-20 | Discharge: 2017-02-20 | Disposition: A | Discharge status: Home / Self Care | Payer: Self-pay | Attending: Emergency Medicine | Admitting: Emergency Medicine

## 2017-02-20 DIAGNOSIS — M545 Low back pain, unspecified: Secondary | ICD-10-CM

## 2017-02-20 DIAGNOSIS — F17210 Nicotine dependence, cigarettes, uncomplicated: Secondary | ICD-10-CM | POA: Insufficient documentation

## 2017-02-20 DIAGNOSIS — Z79899 Other long term (current) drug therapy: Secondary | ICD-10-CM | POA: Insufficient documentation

## 2017-02-20 LAB — URINALYSIS, ROUTINE W REFLEX MICROSCOPIC
Bilirubin Urine: NEGATIVE
Glucose, UA: NEGATIVE mg/dL
HGB URINE DIPSTICK: NEGATIVE
Ketones, ur: NEGATIVE mg/dL
Leukocytes, UA: NEGATIVE
Nitrite: NEGATIVE
Protein, ur: NEGATIVE mg/dL
SPECIFIC GRAVITY, URINE: 1.02 (ref 1.005–1.030)
pH: 5.5 (ref 5.0–8.0)

## 2017-02-20 MED ORDER — NAPROXEN 500 MG PO TABS: 500.0000 mg | ORAL_TABLET | Freq: Two times a day (BID) | ORAL | 2 refills | Status: DC

## 2017-02-20 MED ORDER — KETOROLAC TROMETHAMINE 30 MG/ML IJ SOLN
30.0000 mg | Freq: Once | INTRAMUSCULAR | Status: AC
  Administered 2017-02-20: 30 mg via INTRAMUSCULAR
  Filled 2017-02-20: qty 1

## 2017-02-20 NOTE — ED Triage Notes (Addendum)
Right flank pain for the past 3 days. States he has been taking AZO and drinking cranberry juice without relief.  Pt has hx of kidney stones and pain feels similar. Pt states he feels like his kidney is "swelled up" Pt states he drank 35 beers in one day last week.

## 2017-02-20 NOTE — ED Provider Notes (Signed)
[facility] Emergency Department Provider Note   ____________________________________________    I have reviewed the triage vital signs and the nursing notes.   HISTORY  Chief Complaint Flank Pain     HPI Johnny Russell is a [age] y.o. male who presents with complaints of back pain. Patient reports low right-sided back pain over the last 3 days. He describes it as a moderate aching pain which worsens when he moves in particular ways. Primarily describes it is worse when he flexes at the waist. Denies fevers or chills. He thought it might be a kidney stone so he has been drinking cranberry juice without relief. He has not taken anything for the pain. No fevers or chills. He reports he drank 35 beers in one day last week but denies any episodes of vomiting afterwards. He is not here for alcohol treatment. No urinary retention, no neuro deficits.   History reviewed. No pertinent past medical history.  There are no active problems to display for this patient.   History reviewed. No pertinent surgical history.  Prior to Admission medications   Medication Sig Start Date End Date Taking? Authorizing Provider  Cetirizine HCl (ZYRTEC ALLERGY) 10 MG CAPS Take 1 capsule (10 mg total) by mouth daily. Patient not taking: Reported on 02/20/2017 05/02/15   Christella ScheuermannLawrence, Emma V, PA-C  cyclobenzaprine (FLEXERIL) 10 MG tablet Take 1 tablet (10 mg total) by mouth 3 (three) times daily as needed for muscle spasms. Patient not taking: Reported on 02/20/2017 10/04/16   Hagler, Jami L, PA-C  fluticasone (VERAMYST) 27.5 MCG/SPRAY nasal spray Place 2 sprays into the nose daily. Patient not taking: Reported on 02/20/2017 01/26/15   Kem Boroughsriplett, Cari B, FNP  hydrOXYzine (ATARAX/VISTARIL) 25 MG tablet Take 1 tablet (25 mg total) by mouth 3 (three) times daily as needed for itching. Patient not taking: Reported on 02/20/2017 05/02/15   Christella ScheuermannLawrence, Emma V, PA-C  naproxen (NAPROSYN) 500 MG  tablet Take 1 tablet (500 mg total) by mouth 2 (two) times daily with a meal. 02/20/17   Jene EveryKinner, Lenda Baratta, MD  predniSONE (DELTASONE) 10 MG tablet Taper: 6,5,4,3,2,1 Patient not taking: Reported on 02/20/2017 05/02/15   Christella ScheuermannLawrence, Emma V, PA-C     Allergies Omeprazole and Omni-pac [cefdinir]  History reviewed. No pertinent family history.  Social History Social History  Substance Use Topics  . Smoking status: Current Every Day Smoker    Packs/day: 1.00    Types: Cigarettes  . Smokeless tobacco: Never Used  . Alcohol use Yes     Comment: occ    Review of Systems  Constitutional: No fever/chills     Gastrointestinal: No abdominal pain.  No nausea, no vomiting.   Genitourinary: Negative for hematuria Musculoskeletal: As above Skin: Negative for rash. Neurological: Negative for neuro deficits    ____________________________________________   PHYSICAL EXAM:  VITAL SIGNS: ED Triage Vitals [02/20/17 0924]  Enc Vitals Group     BP (!) 142/91     Pulse Rate 87     Resp 18     Temp 98.1 F (36.7 C)     Temp Source Oral     SpO2 98 %     Weight 90.7 kg (200 lb)     Height 1.778 m (5\' 10" )     Head Circumference      Peak Flow      Pain Score 8     Pain Loc      Pain Edu?      Excl.  in GC?     Constitutional: Alert and oriented. No acute distress.  Eyes: Conjunctivae are normal.   Mouth/Throat: Mucous membranes are moist.   Cardiovascular: Normal rate, regular rhythm.  Respiratory: Normal respiratory effort.  No retractions. Genitourinary: deferred Musculoskeletal:Back: No vertebral tenderness to palpation. Paraspinal right lumbar muscle spasm with significant tenderness to palpation that exactly mimics his pain. Pain is worse when he flexes at the waist. Normal strength in both lower extremities. No saddle anesthesia Neurologic:  Normal speech and language. No gross focal neurologic deficits are appreciated.   Skin:  Skin is warm, dry and intact. No rash  noted.   ____________________________________________   LABS (all labs ordered are listed, but only abnormal results are displayed)  Labs Reviewed  URINALYSIS, ROUTINE W REFLEX MICROSCOPIC   ____________________________________________  EKG   ____________________________________________  RADIOLOGY  None ____________________________________________   PROCEDURES  Procedure(s) performed: No    Critical Care performed: No ____________________________________________   INITIAL IMPRESSION / ASSESSMENT AND PLAN / ED COURSE  Pertinent labs & imaging results that were available during my care of the patient were reviewed by me and considered in my medical decision making (see chart for details).  Patient well-appearing and in no acute distress. Urinalysis is normal. Does not appear consistent with a kidney stone especially given the location of his pain and normal urine. This seems to be muscular skeletal nature, patient is a clear muscle spasm which is tender as described above. Recommend supportive care including anti-inflammatories.   ____________________________________________   FINAL CLINICAL IMPRESSION(S) / ED DIAGNOSES  Final diagnoses:  Acute right-sided low back pain without sciatica      NEW MEDICATIONS STARTED DURING THIS VISIT:  Discharge Medication List as of 02/20/2017 11:06 AM       Note:  This document was prepared using Dragon voice recognition software and may include unintentional dictation errors.    Jene Every, MD 02/20/17 1344

## 2017-06-17 ENCOUNTER — Emergency Department
Admission: EM | Admit: 2017-06-17 | Discharge: 2017-06-17 | Disposition: A | Discharge status: Home / Self Care | Payer: Self-pay | Attending: Emergency Medicine | Admitting: Emergency Medicine

## 2017-06-17 DIAGNOSIS — F17210 Nicotine dependence, cigarettes, uncomplicated: Secondary | ICD-10-CM | POA: Insufficient documentation

## 2017-06-17 DIAGNOSIS — K047 Periapical abscess without sinus: Secondary | ICD-10-CM | POA: Insufficient documentation

## 2017-06-17 MED ORDER — LIDOCAINE HCL (PF) 1 % IJ SOLN
5.0000 mL | Freq: Once | INTRAMUSCULAR | Status: AC
  Administered 2017-06-17: 5 mL via INTRADERMAL

## 2017-06-17 MED ORDER — LIDOCAINE HCL (PF) 1 % IJ SOLN
INTRAMUSCULAR | Status: AC
  Administered 2017-06-17: 5 mL via INTRADERMAL
  Filled 2017-06-17: qty 5

## 2017-06-17 MED ORDER — PENICILLIN V POTASSIUM 500 MG PO TABS: 500.0000 mg | ORAL_TABLET | Freq: Four times a day (QID) | ORAL | 0 refills | Status: AC

## 2017-06-17 NOTE — Discharge Instructions (Signed)
OPTIONS FOR DENTAL FOLLOW UP CARE ° °[place] [facility] and [facility] - Local Safety Net Dental Clinics °http://www.ncdhhs.gov/dph/oralhealth/services/safetynetclinics.htm °  °[facility] (336-562-3123) ° °[facility] [place] (919-933-9087) ° °[facility] ([phone] ext [phone]) ° °[facility] ([phone]) ° °[facility] ([phone]) °This clinic caters to the indigent population and is on a lottery system. °Location: °[facility], [address], [place] °Clinic Hours: °Wednesdays from 6pm - 9pm, patients seen by a lottery system. °For dates, call or go to [email or web address].edu/[facility]/patients/Dental-[facility] °Services: °Cleanings, fillings and simple extractions. °Payment Options: °DENTAL WORK IS FREE OF CHARGE. Bring proof of income or support. °Best way to get seen: °Arrive at 5:15 pm - this is a lottery, NOT first come/first serve, so arriving earlier will not increase your chances of being seen. °  °  °[facility] °[phone] °Select option 1 for emergencies °  °Location: °[facility], [address], [place] °Clinic Hours: °No walk-ins accepted - call the day before to schedule an appointment. °Check in times are 9:30 am and 1:30 pm. °Services: °Simple extractions, temporary fillings, pulpectomy/pulp debridement, uncomplicated abscess drainage. °Payment Options: °PAYMENT IS DUE AT THE TIME OF SERVICE.  Fee is usually $100-200, additional surgical procedures (e.g. abscess drainage) may be extra. °Cash, checks, Visa/MasterCard accepted.  Can file Medicaid if patient is covered for dental - patient should call case worker to check. °No discount for [facility] patients. °Best way to get seen: °MUST call the day before and get onto the schedule. Can usually be seen the next 1-2 days. No walk-ins accepted. °  °  °[facility] °[phone] °   °Location: °[facility], [address], [place] °Clinic Hours: °M, W, Th, F 8am or 1:30pm, Tues 9a or 1:30 - first come/first served. °Services: °Simple extractions, temporary fillings, uncomplicated abscess drainage.  You do not need to be an [place] resident. °Payment Options: °PAYMENT IS DUE AT THE TIME OF SERVICE. °Dental insurance, otherwise sliding scale - bring proof of income or support. °Depending on income and treatment needed, cost is usually $50-200. °Best way to get seen: °Arrive early as it is first come/first served. °  °  °[facility] °[phone] °  °Location: °[address] °Clinic Hours: °Mon-Thu 8a-5p °Services: °Most basic dental services including extractions and fillings. °Payment Options: °PAYMENT IS DUE AT THE TIME OF SERVICE. °Sliding scale, up to 50% off - bring proof if income or support. °Medicaid with dental option accepted. °Best way to get seen: °Call to schedule an appointment, can usually be seen within 2 weeks OR they will try to see walk-ins - show up at 8a or 2p (you may have to wait). °  °  °[facility] °919-245-2435 °[place] RESIDENTS ONLY °  °Location: °[facility], [address], [place], [place] [address] °Clinic Hours: By appointment only. °Monday - Thursday 8am-5pm, Friday 8am-12pm °Services: Cleanings, fillings, extractions. °Payment Options: °PAYMENT IS DUE AT THE TIME OF SERVICE. °Cash, Visa or MasterCard. Sliding scale - $30 minimum per service. °Best way to get seen: °Come in to office, complete packet and make an appointment - need proof of income °or support monies for each household member and proof of [place] residence. °Usually takes about a month to get in. °  °  °[facility] °[phone] °  °Location: °[address]   [place] °Clinic Hours: Walk-in Urgent Care Dental Services are offered Monday-Friday  mornings only. °The numbers of emergencies accepted daily is limited to the number of °providers available. °Maximum 15 - Mondays, Wednesdays & Thursdays °Maximum 10 - Tuesdays & Fridays °Services: °You do not need to be a [place] resident to be seen for a dental emergency. °Emergencies are defined as pain, swelling, abnormal bleeding, or dental trauma. Walkins will receive x-rays if needed. °NOTE: Dental cleaning is not an emergency. °Payment Options: °PAYMENT IS DUE AT THE TIME OF SERVICE. °Minimum co-pay is $40.00 for uninsured patients. °Minimum co-pay is $3.00 for Medicaid with dental coverage. °Dental Insurance is accepted and must be presented at time of visit. °Medicare does not cover dental. °Forms of payment: Cash, credit card, checks. °Best way to get seen: °If not previously registered with the clinic, walk-in dental registration begins at 7:15 am and is on a first come/first serve basis. °If previously registered with the clinic, call to make an appointment. °  °  °[facility] °[phone] °[place] RESIDENTS ONLY °  °Location: °[address], [place], [place] °Clinic Hours: °Mon-Thu 10a-2p °Services: Extractions only! °Payment Options: °FREE (donations accepted) - bring proof of income or support °Best way to get seen: °Call and schedule an appointment OR come at 8am on the 1st Monday of every month (except for holidays) when it is first come/first served. °  °  °[facility] °[phone] °  °Location: °[address], [place] °Clinic Hours: °Friday mornings °Services, Payment Options, Best way to get seen: °Call for info °

## 2017-06-17 NOTE — ED Notes (Signed)

## 2017-06-17 NOTE — ED Notes (Signed)
Pt signed hardcopy of ED discharge and placed on pt's chart due to signature pad not responding.

## 2017-06-17 NOTE — ED Triage Notes (Signed)
Patient reports swelling to left side of face from under left eye down into left cheek under nose.  Swelling noted to left side of face.  Patient reports concerned it could be an abscess because he has had them before.

## 2017-06-17 NOTE — ED Provider Notes (Signed)
[facility] Emergency Department Provider Note   ____________________________________________   I have reviewed the triage vital signs and the nursing notes.   HISTORY  Chief Complaint Facial Swelling   History limited by: Not Limited   HPI Johnny Russell is a 41 y.o. male who presents to the emergency department today because of facial swelling and pain.   LOCATION:left face DURATION:2-3 days TIMING: gradually worsening SEVERITY: severe CONTEXT: patient with poor dentition states pain started in left upper incisor. Has progressively gotten worse. Has history of dental infection/abscess in the past. MODIFYING FACTORS: worse with palpation ASSOCIATED SYMPTOMS: no fevers  Per medical record review no history of diabetes.  No past medical history on file.  There are no active problems to display for this patient.   No past surgical history on file.  Prior to Admission medications   Medication Sig Start Date End Date Taking? Authorizing Provider  Cetirizine HCl (ZYRTEC ALLERGY) 10 MG CAPS Take 1 capsule (10 mg total) by mouth daily. Patient not taking: Reported on 02/20/2017 05/02/15   Christella Scheuermann, PA-C  cyclobenzaprine (FLEXERIL) 10 MG tablet Take 1 tablet (10 mg total) by mouth 3 (three) times daily as needed for muscle spasms. Patient not taking: Reported on 02/20/2017 10/04/16   Hagler, Jami L, PA-C  fluticasone (VERAMYST) 27.5 MCG/SPRAY nasal spray Place 2 sprays into the nose daily. Patient not taking: Reported on 02/20/2017 01/26/15   Kem Boroughs B, FNP  hydrOXYzine (ATARAX/VISTARIL) 25 MG tablet Take 1 tablet (25 mg total) by mouth 3 (three) times daily as needed for itching. Patient not taking: Reported on 02/20/2017 05/02/15   Christella Scheuermann, PA-C  naproxen (NAPROSYN) 500 MG tablet Take 1 tablet (500 mg total) by mouth 2 (two) times daily with a meal. 02/20/17   Jene Every, MD  predniSONE (DELTASONE) 10 MG tablet Taper:  6,5,4,3,2,1 Patient not taking: Reported on 02/20/2017 05/02/15   Christella Scheuermann, PA-C    Allergies Omeprazole and Omni-pac [cefdinir]  No family history on file.  Social History Social History  Substance Use Topics  . Smoking status: Current Every Day Smoker    Packs/day: 1.00    Types: Cigarettes  . Smokeless tobacco: Never Used  . Alcohol use Yes     Comment: occ    Review of Systems Constitutional: No fever/chills Eyes: No visual changes. ENT: Positive for tooth pain and left facial swelling. Cardiovascular: Denies chest pain. Respiratory: Denies shortness of breath. Gastrointestinal: No abdominal pain.  No nausea, no vomiting.  No diarrhea.   Genitourinary: Negative for dysuria. Musculoskeletal: Negative for back pain. Skin: Negative for rash. Neurological: Negative for headaches, focal weakness or numbness.  ____________________________________________   PHYSICAL EXAM:  VITAL SIGNS: ED Triage Vitals  Enc Vitals Group     BP 06/17/17 0613 123/71     Pulse Rate 06/17/17 0613 83     Resp 06/17/17 0613 20     Temp 06/17/17 0613 98 F (36.7 C)     Temp Source 06/17/17 0613 Oral     SpO2 06/17/17 0613 99 %     Weight 06/17/17 0612 200 lb (90.7 kg)     Height 06/17/17 0612  (1.778 m)     Head Circumference --      Peak Flow --      Pain Score 06/17/17 0613 10   Constitutional: Alert and oriented. Well appearing and in no distress. Eyes: Conjunctivae are normal.  ENT   Head: Normocephalic and atraumatic.  Nose: No congestion/rhinnorhea.   Mouth/Throat: Poor dentition. Mild swelling noted to left cheek.   Neck: No stridor. Hematological/Lymphatic/Immunilogical: No cervical lymphadenopathy. Cardiovascular: Normal rate, regular rhythm.  No murmurs, rubs, or gallops.  Respiratory: Normal respiratory effort without tachypnea nor retractions. Breath sounds are clear and equal bilaterally. No wheezes/rales/rhonchi. Gastrointestinal: Soft and  non tender. No rebound. No guarding.  Genitourinary: Deferred Musculoskeletal: Normal range of motion in all extremities.  Neurologic:  Normal speech and language. No gross focal neurologic deficits are appreciated.  Skin:  Skin is warm, dry and intact. No rash noted. Psychiatric: Mood and affect are normal. Speech and behavior are normal. Patient exhibits appropriate insight and judgment.  ____________________________________________    LABS (pertinent positives/negatives)  None  ____________________________________________   EKG  None  ____________________________________________    RADIOLOGY  None  ____________________________________________   PROCEDURES  Procedures  ____________________________________________   INITIAL IMPRESSION / ASSESSMENT AND PLAN / ED COURSE  Pertinent labs & imaging results that were available during my care of the patient were reviewed by me and considered in my medical decision making (see chart for details).  patient presented to the emergency department today because of concerns for left facial swelling and pain. Think likely patient has a dental infection. Bedside ultrasound did not show any pocket of pus. I did however at the patient's request try to aspirate for pus. No pus was elicited. Will plan on giving patient prescription for penicillin and dental follow-up information. Discussed plan with patient.   ____________________________________________   FINAL CLINICAL IMPRESSION(S) / ED DIAGNOSES  Final diagnoses:  Dental infection     Note: This dictation was prepared with Dragon dictation. Any transcriptional errors that result from this process are unintentional     Phineas Semen, MD 06/17/17 [phone]

## 2017-06-17 NOTE — ED Notes (Addendum)
Pt with hx of poor dental hygiene states he developed swelling to his left cheek and on top of top lip. Pt reports he broke his central incisor a while ago and reports the swelling and pressure developed in the last 3 days. Pt denies fever or drainage from mouth.

## 2017-07-08 ENCOUNTER — Emergency Department
Admission: EM | Admit: 2017-07-08 | Discharge: 2017-07-08 | Disposition: A | Discharge status: Home / Self Care | Payer: Self-pay | Attending: Emergency Medicine | Admitting: Emergency Medicine

## 2017-07-08 ENCOUNTER — Encounter: Payer: Self-pay | Admitting: Emergency Medicine

## 2017-07-08 DIAGNOSIS — K047 Periapical abscess without sinus: Secondary | ICD-10-CM | POA: Insufficient documentation

## 2017-07-08 DIAGNOSIS — F17210 Nicotine dependence, cigarettes, uncomplicated: Secondary | ICD-10-CM | POA: Insufficient documentation

## 2017-07-08 DIAGNOSIS — Z9119 Patient's noncompliance with other medical treatment and regimen: Secondary | ICD-10-CM | POA: Insufficient documentation

## 2017-07-08 DIAGNOSIS — Z79899 Other long term (current) drug therapy: Secondary | ICD-10-CM | POA: Insufficient documentation

## 2017-07-08 HISTORY — DX: Cutaneous abscess, unspecified: L02.91

## 2017-07-08 MED ORDER — LIDOCAINE VISCOUS 2 % MT SOLN: 5.0000 mL | Freq: Four times a day (QID) | OROMUCOSAL | 0 refills | Status: DC | PRN

## 2017-07-08 MED ORDER — IBUPROFEN 800 MG PO TABS: 800.0000 mg | ORAL_TABLET | Freq: Three times a day (TID) | ORAL | 0 refills | Status: DC | PRN

## 2017-07-08 MED ORDER — AMOXICILLIN 500 MG PO CAPS: 500.0000 mg | ORAL_CAPSULE | Freq: Three times a day (TID) | ORAL | 0 refills | Status: DC

## 2017-07-08 NOTE — ED Triage Notes (Signed)
Pt comes into the ED via POV c/o abscess that starts in his mouth and the pain starts to travel to his nose.  Patient states he has had the problem in the past and he was able to lance it from inside his mouth.  Patient in NAD at this time with even and unlabored respirations.  Patient has no seen abscess or redness noted at this time.

## 2017-07-08 NOTE — ED Provider Notes (Signed)
[facility] Emergency Department Provider Note   ____________________________________________   First MD Initiated Contact with Patient 07/08/17 1234     (approximate)  I have reviewed the triage vital signs and the nursing notes.   HISTORY  Chief Complaint Abscess    HPI Johnny Russell is a 41 y.o. male continued dental pain secondary noncompliance with follow-up with dental clinic.Patient was seen in June and October for the same complaint. Patient continue accept the antibiotics without follow-up with dental clinic. Patient stated lack of funds is tender in his compliance. Patient also was upset because they [facility] is scheduled multiple visits that he could not afford. Patient denies drainage or fever at this time.  Past Medical History:  Diagnosis Date  . Abscess     There are no active problems to display for this patient.   History reviewed. No pertinent surgical history.  Prior to Admission medications   Medication Sig Start Date End Date Taking? Authorizing Provider  amoxicillin (AMOXIL) 500 MG capsule Take 1 capsule (500 mg total) 3 (three) times daily by mouth. 07/08/17   Joni ReiningSmith, Aliscia Clayton K, PA-C  Cetirizine HCl (ZYRTEC ALLERGY) 10 MG CAPS Take 1 capsule (10 mg total) by mouth daily. Patient not taking: Reported on 02/20/2017 05/02/15   Christella ScheuermannLawrence, Emma V, PA-C  cyclobenzaprine (FLEXERIL) 10 MG tablet Take 1 tablet (10 mg total) by mouth 3 (three) times daily as needed for muscle spasms. Patient not taking: Reported on 02/20/2017 10/04/16   Hagler, Jami L, PA-C  fluticasone (VERAMYST) 27.5 MCG/SPRAY nasal spray Place 2 sprays into the nose daily. Patient not taking: Reported on 02/20/2017 01/26/15   Kem Boroughsriplett, Cari B, FNP  hydrOXYzine (ATARAX/VISTARIL) 25 MG tablet Take 1 tablet (25 mg total) by mouth 3 (three) times daily as needed for itching. Patient not taking: Reported on 02/20/2017 05/02/15   Christella ScheuermannLawrence, Emma V,  PA-C  ibuprofen (ADVIL,MOTRIN) 800 MG tablet Take 1 tablet (800 mg total) every 8 (eight) hours as needed by mouth. 07/08/17   Joni ReiningSmith, Ori Kreiter K, PA-C  lidocaine (XYLOCAINE) 2 % solution Use as directed 5 mLs every 6 (six) hours as needed in the mouth or throat for mouth pain. 07/08/17   Joni ReiningSmith, Maksym Pfiffner K, PA-C  naproxen (NAPROSYN) 500 MG tablet Take 1 tablet (500 mg total) by mouth 2 (two) times daily with a meal. 02/20/17   Jene EveryKinner, Robert, MD  predniSONE (DELTASONE) 10 MG tablet Taper: 6,5,4,3,2,1 Patient not taking: Reported on 02/20/2017 05/02/15   Christella ScheuermannLawrence, Emma V, PA-C    Allergies Patient has no active allergies.  No family history on file.  Social History Social History   Tobacco Use  . Smoking status: Current Every Day Smoker    Packs/day: 1.00    Types: Cigarettes  . Smokeless tobacco: Never Used  Substance Use Topics  . Alcohol use: Yes    Comment: occ  . 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 ENT: No sore throat. Dental pain Cardiovascular: Denies chest pain. Respiratory: Denies shortness of breath. Gastrointestinal: No abdominal pain.  No nausea, no vomiting.  No diarrhea.  No constipation. Genitourinary: Negative for dysuria. Musculoskeletal: Negative for back pain. Skin: Negative for rash. Neurological: Negative for headaches, focal weakness or numbness.   ____________________________________________   PHYSICAL EXAM:  VITAL SIGNS: ED Triage Vitals  Enc Vitals Group  BP 07/08/17 1207 126/68     Pulse Rate 07/08/17 1207 90     Resp 07/08/17 1207 16     Temp 07/08/17 1207 98.5 F (36.9 C)     Temp Source 07/08/17 1207 Oral     SpO2 07/08/17 1207 97 %     Weight 07/08/17 1207 200 lb (90.7 kg)     Height 07/08/17 1207 5\' 10"  (1.778 m)     Head Circumference --      Peak Flow --      Pain Score 07/08/17 1209 8     Pain Loc --      Pain Edu? --        Excl. in GC? --     Constitutional: Alert and oriented. Well appearing and in no acute distress.  Mouth/Throat: Mucous membranes are moist.  Oropharynx non-erythematous. Multiple devitalize teeth and caries. Mild edema to the left cheek. Hematological/Lymphatic/Immunilogical: No cervical lymphadenopathy. Cardiovascular: Normal rate, regular rhythm. Grossly normal heart sounds.  Good peripheral circulation. Respiratory: Normal respiratory effort.  No retractions. Lungs CTAB. Neurologic:  Normal speech and language. No gross focal neurologic deficits are appreciated. No gait instability. Skin:  Skin is warm, dry and intact. No rash noted. Psychiatric: Mood and affect are normal. Speech and behavior are normal.  ____________________________________________   LABS (all labs ordered are listed, but only abnormal results are displayed)  Labs Reviewed - No data to display ____________________________________________  EKG   ____________________________________________  RADIOLOGY  No results found.  ____________________________________________   PROCEDURES  Procedure(s) performed: None  Procedures  Critical Care performed: No  ____________________________________________   INITIAL IMPRESSION / ASSESSMENT AND PLAN / ED COURSE  As part of my medical decision making, I reviewed the following data within the electronic MEDICAL RECORD NUMBER    Dental pain secondary to poor dentition. Discussed the patient's rationale for follow-up with dental clinic for continued care. [facility]. Patient given a prescription for amoxicillin and ibuprofen. Patient also given a list of dental clinics for follow-up.      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    Joni ReiningSmith, Rilie Glanz K, PA-C 07/08/17 1255    Dionne BucySiadecki, Sebastian, MD 07/08/17 1501

## 2017-07-08 NOTE — Discharge Instructions (Signed)
He will also follow-up from the list of dental clinics provided below. OPTIONS FOR DENTAL FOLLOW UP CARE  [place] Department of Health and Human Services - Local Safety Net Dental Clinics [email or web address].gov/dph/oralhealth/services/[email or web address].htm   [facility] [phone])  [facility] [phone])  [facility] [phone]  [facility] [phone])  [facility] [phone]) This clinic caters to the indigent population and is on a lottery system. Location: [employer], [address], [address], [place] Clinic Hours: Wednesdays from 6pm - 9pm, patients seen by a lottery system. For dates, call or go to [email or web address] Services: Cleanings, fillings and simple extractions. Payment Options: DENTAL WORK IS FREE OF CHARGE. Bring proof of income or support. Best way to get seen: Arrive at 5:15 pm - this is a lottery, NOT first come/first serve, so arriving earlier will not increase your chances of being seen.     [facility] [phone] Select option 1 for emergencies   Location: [facility], [address] [facility], [address], [place] Clinic Hours: No walk-ins accepted - call the day before to schedule an appointment. Check in times are 9:30 am and 1:30 pm. Services: Simple extractions, temporary fillings, pulpectomy/pulp debridement, uncomplicated abscess drainage. Payment Options: PAYMENT IS DUE AT THE TIME OF SERVICE.  Fee is usually $100-200, additional surgical procedures (e.g. abscess drainage) may be extra. Cash, checks, Visa/MasterCard accepted.  Can file Medicaid if patient is covered for dental - patient should call case worker to check. No discount for [facility] patients. Best way to get seen: MUST call the day before and get onto the schedule. Can usually be seen the next 1-2 days. No walk-ins  accepted.     [facility] [phone]   Location: [facility], [address], [place] Clinic Hours: M, W, Th, F 8am or 1:30pm, Tues 9a or 1:30 - first come/first served. Services: Simple extractions, temporary fillings, uncomplicated abscess drainage.  You do not need to be an [place] resident. Payment Options: PAYMENT IS DUE AT THE TIME OF SERVICE. Dental insurance, otherwise sliding scale - bring proof of income or support. Depending on income and treatment needed, cost is usually $50-200. Best way to get seen: Arrive early as it is first come/first served.     [facility] [phone]   Location: [address] Clinic Hours: Mon-Thu 8a-5p Services: Most basic dental services including extractions and fillings. Payment Options: PAYMENT IS DUE AT THE TIME OF SERVICE. Sliding scale, up to 50% off - bring proof if income or support. Medicaid with dental option accepted. Best way to get seen: Call to schedule an appointment, can usually be seen within 2 weeks OR they will try to see walk-ins - show up at 8a or 2p (you may have to wait).     [facility] [phone] [place] RESIDENTS ONLY   Location: [facility], [address], [place], [place] [id number] Clinic Hours: By appointment only. Monday - Thursday 8am-5pm, Friday 8am-12pm Services: Cleanings, fillings, extractions. Payment Options: PAYMENT IS DUE AT THE TIME OF SERVICE. Cash, Visa or MasterCard. Sliding scale - $30 minimum per service. Best way to get seen: Come in to office, complete packet and make an appointment - need proof of income or support monies for each household member and proof of [place] [place] residence. Usually takes about a month to get in.  [facility] [phone]   Location: [address], [facility] Clinic Hours: Walk-in  Urgent Care Dental Services are offered Monday-Friday mornings only. The numbers of emergencies accepted daily is limited to the number of providers available. Maximum 15 - Mondays, Wednesdays & Thursdays Maximum 10 - Tuesdays & Fridays Services: You do not need to be a [facility] resident to be seen for a dental emergency. Emergencies are defined as pain, swelling, abnormal bleeding, or dental trauma. Walkins will receive x-rays if needed. NOTE: Dental cleaning is not an emergency. Payment Options: PAYMENT IS DUE AT THE TIME OF SERVICE. Minimum co-pay is $40.00 for uninsured patients. Minimum co-pay is $3.00 for Medicaid with dental coverage. Dental Insurance is accepted and must be presented at time of visit. Medicare does not cover dental. Forms of payment: Cash, credit card, checks. Best way to get seen: If not previously registered with the clinic, walk-in dental registration begins at 7:15 am and is on a first come/first serve basis. If previously registered with the clinic, call to make an appointment.     [facility] [phone] [place] RESIDENTS ONLY   Location: [address] [place], [place] Clinic Hours: Mon-Thu 10a-2p Services: Extractions only! Payment Options: FREE (donations accepted) - bring proof of income or support Best way to get seen: Call and schedule an appointment OR come at 8am on the 1st Monday of every month (except for holidays) when it is first come/first served.     [facility] [phone]   Location: [address] [place], [place] Clinic Hours: Friday mornings Services, Payment Options, Best way to get seen: Call for info

## 2018-01-18 ENCOUNTER — Other Ambulatory Visit: Payer: Self-pay

## 2018-01-18 ENCOUNTER — Emergency Department
Admission: EM | Admit: 2018-01-18 | Discharge: 2018-01-18 | Disposition: A | Discharge status: Home / Self Care | Payer: Self-pay | Attending: Emergency Medicine | Admitting: Emergency Medicine

## 2018-01-18 DIAGNOSIS — Y929 Unspecified place or not applicable: Secondary | ICD-10-CM | POA: Insufficient documentation

## 2018-01-18 DIAGNOSIS — Y998 Other external cause status: Secondary | ICD-10-CM | POA: Insufficient documentation

## 2018-01-18 DIAGNOSIS — Z5321 Procedure and treatment not carried out due to patient leaving prior to being seen by health care provider: Secondary | ICD-10-CM | POA: Insufficient documentation

## 2018-01-18 DIAGNOSIS — Y939 Activity, unspecified: Secondary | ICD-10-CM | POA: Insufficient documentation

## 2018-01-18 DIAGNOSIS — S91332A Puncture wound without foreign body, left foot, initial encounter: Secondary | ICD-10-CM | POA: Insufficient documentation

## 2018-01-18 DIAGNOSIS — W450XXA Nail entering through skin, initial encounter: Secondary | ICD-10-CM | POA: Insufficient documentation

## 2018-01-18 NOTE — ED Notes (Signed)
Called in the waiting room with no answer. °

## 2018-01-18 NOTE — ED Triage Notes (Signed)
Reports stepped on nail Saturday afternoon.  Reports now area painful and red.

## 2018-03-17 ENCOUNTER — Other Ambulatory Visit: Payer: Self-pay

## 2018-03-17 ENCOUNTER — Emergency Department
Admission: EM | Admit: 2018-03-17 | Discharge: 2018-03-17 | Disposition: A | Discharge status: Home / Self Care | Payer: Self-pay | Attending: Emergency Medicine | Admitting: Emergency Medicine

## 2018-03-17 ENCOUNTER — Encounter: Payer: Self-pay | Admitting: Emergency Medicine

## 2018-03-17 DIAGNOSIS — Z79899 Other long term (current) drug therapy: Secondary | ICD-10-CM | POA: Insufficient documentation

## 2018-03-17 DIAGNOSIS — R21 Rash and other nonspecific skin eruption: Secondary | ICD-10-CM | POA: Insufficient documentation

## 2018-03-17 DIAGNOSIS — F17210 Nicotine dependence, cigarettes, uncomplicated: Secondary | ICD-10-CM | POA: Insufficient documentation

## 2018-03-17 DIAGNOSIS — W57XXXA Bitten or stung by nonvenomous insect and other nonvenomous arthropods, initial encounter: Secondary | ICD-10-CM | POA: Insufficient documentation

## 2018-03-17 MED ORDER — PERMETHRIN 5 % EX CREA: TOPICAL_CREAM | CUTANEOUS | 1 refills | Status: DC

## 2018-03-17 MED ORDER — METHYLPREDNISOLONE SODIUM SUCC 125 MG IJ SOLR
125.0000 mg | Freq: Once | INTRAMUSCULAR | Status: AC
  Administered 2018-03-17: 125 mg via INTRAMUSCULAR
  Filled 2018-03-17: qty 2

## 2018-03-17 MED ORDER — PREDNISONE 10 MG PO TABS: ORAL_TABLET | ORAL | 0 refills | Status: DC

## 2018-03-17 MED ORDER — DIPHENHYDRAMINE HCL 25 MG PO CAPS: 25.0000 mg | ORAL_CAPSULE | ORAL | 0 refills | Status: DC | PRN

## 2018-03-17 NOTE — ED Notes (Signed)
See triage note  Presents with several small areas of rash to arms and legs  States he noticed areas about 2-3 weeks ago

## 2018-03-17 NOTE — ED Provider Notes (Signed)
[facility] Emergency Department Provider Note  ____________________________________________  Time seen: Approximately 11:14 AM  I have reviewed the triage vital signs and the nursing notes.   HISTORY  Chief Complaint Rash    HPI Johnny Russell is a 42 y.o. male that presents to the emergency department for evaluation of rash to arms and legs for 3 weeks. Areas are extremely itchy. Areas are spreading. He has eczema but this feels different. 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 No new lotions, creams, medications, detergents.  He lives in a boardinghouse.  He has checked his mattress and has not seen any bedbugs.  He does not have any pets.  He is unsure if anyone else has a rash in the boarding house.  No fever, chills.   Past Medical History:  Diagnosis Date  . Abscess     There are no active problems to display for this patient.   History reviewed. No pertinent surgical history.  Prior to Admission medications   Medication Sig Start Date End Date Taking? Authorizing Provider  amoxicillin (AMOXIL) 500 MG capsule Take 1 capsule (500 mg total) 3 (three) times daily by mouth. 07/08/17   Joni ReiningSmith, Ronald K, PA-C  Cetirizine HCl (ZYRTEC ALLERGY) 10 MG CAPS Take 1 capsule (10 mg total) by mouth daily. Patient not taking: Reported on 02/20/2017 05/02/15   Christella ScheuermannLawrence, Emma V, PA-C  cyclobenzaprine (FLEXERIL) 10 MG tablet Take 1 tablet (10 mg total) by mouth 3 (three) times daily as needed for muscle spasms. Patient not taking: Reported on 02/20/2017 10/04/16   Hagler, Clearnce HastenJami L, PA-C  diphenhydrAMINE (BENADRYL) 25 mg capsule Take 1 capsule (25 mg total) by mouth every 4 (four) hours as needed. 03/17/18 03/17/19  Enid DerryWagner, Aleena Kirkeby, PA-C  fluticasone (VERAMYST) 27.5 MCG/SPRAY nasal spray Place 2 sprays into the nose daily. Patient not taking: Reported on 02/20/2017 01/26/15   Kem Boroughsriplett, Cari B, FNP  hydrOXYzine (ATARAX/VISTARIL) 25 MG tablet Take 1 tablet (25  mg total) by mouth 3 (three) times daily as needed for itching. Patient not taking: Reported on 02/20/2017 05/02/15   Christella ScheuermannLawrence, Emma V, PA-C  ibuprofen (ADVIL,MOTRIN) 800 MG tablet Take 1 tablet (800 mg total) every 8 (eight) hours as needed by mouth. 07/08/17   Joni ReiningSmith, Ronald K, PA-C  lidocaine (XYLOCAINE) 2 % solution Use as directed 5 mLs every 6 (six) hours as needed in the mouth or throat for mouth pain. 07/08/17   Joni ReiningSmith, Ronald K, PA-C  naproxen (NAPROSYN) 500 MG tablet Take 1 tablet (500 mg total) by mouth 2 (two) times daily with a meal. 02/20/17   Jene EveryKinner, Robert, MD  permethrin (ELIMITE) 5 % cream Message cream thoroughly into skin from neck to soles of feet, including under nails and to scalp and face. Wash after 8-14 hours. Repeat in 2 weeks. 03/17/18   Enid DerryWagner, Tammara Massing, PA-C  predniSONE (DELTASONE) 10 MG tablet Take 6 tablets on day 1, take 5 tablets on day 2, take 4 tablets on day 3, take 3 tablets on day 4, take 2 tablets on day 5, take 1 tablet on day 6 03/17/18   Enid DerryWagner, Zhoey Blackstock, PA-C    Allergies Patient has no known allergies.  History reviewed. No pertinent family history.  Social History Social History   Tobacco Use  . Smoking status: Current Every Day Smoker    Packs/day: 1.00    Types: Cigarettes  . Smokeless tobacco: Never Used  Substance Use Topics  . Alcohol use: Yes  Comment: occ  . Drug use: Yes    Types: Marijuana    Comment: daily use x 3 joints     Review of Systems  Constitutional: No fever/chills Respiratory: No SOB. Gastrointestinal: No nausea, no vomiting.  Musculoskeletal: Negative for musculoskeletal pain. Skin: Negative for abrasions, lacerations, ecchymosis. Positive for rash.    ____________________________________________   PHYSICAL EXAM:  VITAL SIGNS: ED Triage Vitals  Enc Vitals Group     BP 03/17/18 1045 (!) 132/112     Pulse Rate 03/17/18 1045 87     Resp 03/17/18 1045 20     Temp 03/17/18 1045 98.3 F (36.8 C)     Temp Source  03/17/18 1045 Oral     SpO2 03/17/18 1045 97 %     Weight 03/17/18 1045 200 lb (90.7 kg)     Height 03/17/18 1045 5\' 10"  (1.778 m)     Head Circumference --      Peak Flow --      Pain Score 03/17/18 1051 Asleep     Pain Loc --      Pain Edu? --      Excl. in GC? --      Constitutional: Alert and oriented. Well appearing and in no acute distress. Eyes: Conjunctivae are normal. PERRL. EOMI. Head: Atraumatic. ENT:      Ears:      Nose: No congestion/rhinnorhea.      Mouth/Throat: Mucous membranes are moist.  Neck: No stridor.   Cardiovascular: Normal rate, regular rhythm.  Good peripheral circulation. Respiratory: Normal respiratory effort without tachypnea or retractions. Lungs CTAB. Good air entry to the bases with no decreased or absent breath sounds. Musculoskeletal: Full range of motion to all extremities. No gross deformities appreciated. Neurologic:  Normal speech and language. No gross focal neurologic deficits are appreciated.  Skin:  Skin is warm, dry and intact. 1/4 cm scattered pink macules and excoriations to arms and a few spots to shins. No rash to hands or feet.  Psychiatric: Mood and affect are normal. Speech and behavior are normal. Patient exhibits appropriate insight and judgement.   ____________________________________________   LABS (all labs ordered are listed, but only abnormal results are displayed)  Labs Reviewed - No data to display ____________________________________________  EKG   ____________________________________________  RADIOLOGY   No results found.  ____________________________________________    PROCEDURES  Procedure(s) performed:    Procedures    Medications  methylPREDNISolone sodium succinate (SOLU-MEDROL) 125 mg/2 mL injection 125 mg (125 mg Intramuscular Given 03/17/18 1144)     ____________________________________________   INITIAL IMPRESSION / ASSESSMENT AND PLAN / ED COURSE  Pertinent labs & imaging  results that were available during my care of the patient were reviewed by me and considered in my medical decision making (see chart for details).  Review of the [place] CSRS was performed in accordance of the [place] prior to dispensing any controlled drugs.     Patient's diagnosis is consistent with mite bites.  Vital signs and exam are reassuring.  Patient will recheck for bedbugs.  Patient will be covered for scabies.  Patient will be discharged home with prescriptions for prednisone, permethrin, Benadryl. Patient is to follow up with dermatology as directed. Patient is given ED precautions to return to the ED for any worsening or new symptoms.     ____________________________________________  FINAL CLINICAL IMPRESSION(S) / ED DIAGNOSES  Final diagnoses:  Bug bite, initial encounter      NEW MEDICATIONS STARTED DURING THIS VISIT:  ED Discharge  Orders        Ordered    predniSONE (DELTASONE) 10 MG tablet     03/17/18 1216    permethrin (ELIMITE) 5 % cream     03/17/18 1216    diphenhydrAMINE (BENADRYL) 25 mg capsule  Every 4 hours PRN     03/17/18 1216          This chart was dictated using voice recognition software/Dragon. Despite best efforts to proofread, errors can occur which can change the meaning. Any change was purely unintentional.    Enid Derry, PA-C 03/17/18 1846    Jene Every, MD 03/18/18 [phone]

## 2018-03-17 NOTE — ED Triage Notes (Signed)
Pt reports that he has had a rash for the last few weeks. Several spots all over his body. He has scabs where he has itched them open. [place]

## 2019-08-03 ENCOUNTER — Emergency Department
Admission: EM | Admit: 2019-08-03 | Discharge: 2019-08-03 | Disposition: A | Discharge status: Home / Self Care | Payer: Self-pay | Attending: Student in an Organized Health Care Education/Training Program | Admitting: Student in an Organized Health Care Education/Training Program

## 2019-08-03 ENCOUNTER — Encounter: Payer: Self-pay | Admitting: Emergency Medicine

## 2019-08-03 ENCOUNTER — Emergency Department: Payer: Self-pay

## 2019-08-03 ENCOUNTER — Other Ambulatory Visit: Payer: Self-pay

## 2019-08-03 DIAGNOSIS — F17210 Nicotine dependence, cigarettes, uncomplicated: Secondary | ICD-10-CM | POA: Insufficient documentation

## 2019-08-03 DIAGNOSIS — K5900 Constipation, unspecified: Secondary | ICD-10-CM | POA: Insufficient documentation

## 2019-08-03 NOTE — ED Provider Notes (Signed)
[facility] Emergency Department Provider Note  ____________________________________________   First MD Initiated Contact with Patient 08/03/19 1631     (approximate)  I have reviewed the triage vital signs and the nursing notes.   HISTORY  Chief Complaint Constipation    HPI Johnny Russell is a 43 y.o. male presents emergency department complaint of constipation.  States he has not had a regular bowel movement since Saturday.  He tried to have a bowel movement today at work and got part of it out broken off and push the rest in.  Patient states he had a diet change recently where he quit drinking beer.  He states he used to drink a beer every morning and it made him have a bowel movement.  States he was feeling a lot of pressure in the rectum and it was painful so he thought he should come to the emergency department.  He denies any fever or chills.  He states the pain is gone at this time.  He states he just wanted to make sure he was not going to rip something open.  He did take 2 Colace prior to arrival.  He denies any bleeding from the rectum.  He is denying abdominal pain at this time.    Past Medical History:  Diagnosis Date  . Abscess     There are no active problems to display for this patient.   History reviewed. No pertinent surgical history.  Prior to Admission medications   Medication Sig Start Date End Date Taking? Authorizing Provider  diphenhydrAMINE (BENADRYL) 25 mg capsule Take 1 capsule (25 mg total) by mouth every 4 (four) hours as needed. 03/17/18 08/03/19  Enid Derry, PA-C    Allergies Patient has no known allergies.  No family history on file.  Social History Social History   Tobacco Use  . Smoking status: Current Every Day Smoker    Packs/day: 1.00    Types: Cigarettes  . Smokeless tobacco: Never Used  Substance Use Topics  . Alcohol use: Yes    Comment: occ  . 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 ENT: No sore throat. Respiratory: Denies cough Gastrointestinal: Positive constipation Genitourinary: Negative for dysuria. Musculoskeletal: Negative for back pain. Skin: Negative for rash.    ____________________________________________   PHYSICAL EXAM:  VITAL SIGNS: ED Triage Vitals  Enc Vitals Group     BP 08/03/19 1611 138/73     Pulse Rate 08/03/19 1609 (!) (P) 110     Resp 08/03/19 1609 (P) 20     Temp 08/03/19 1609 (P) 98.3 F (36.8 C)     Temp Source 08/03/19 1609 (P) Oral     SpO2 08/03/19 1609 (P) 97 %     Weight 08/03/19 1606 203 lb (92.1 kg)     Height 08/03/19 1606 5\' 10"  (1.778 m)     Head Circumference --      Peak Flow --      Pain Score 08/03/19 1606 0     Pain Loc --      Pain Edu? --      Excl. in GC? --     Constitutional: Alert and oriented. Well appearing and in no acute distress. Eyes: Conjunctivae are normal.  Head: Atraumatic. Nose: No congestion/rhinnorhea. Mouth/Throat: Mucous membranes are moist.   Neck:  supple no lymphadenopathy noted Cardiovascular: Normal  rate, regular rhythm. Heart sounds are normal Respiratory: Normal respiratory effort.  No retractions, lungs c t a  Abd: soft nontender bs normal all 4 quad GU: deferred Rectal exam deferred by the patient Musculoskeletal: FROM all extremities, warm and well perfused Neurologic:  Normal speech and language.  Skin:  Skin is warm, dry and intact. No rash noted. Psychiatric: Mood and affect are normal. Speech and behavior are normal.  ____________________________________________   LABS (all labs ordered are listed, but only abnormal results are displayed)  Labs Reviewed - No data to display ____________________________________________   ____________________________________________  RADIOLOGY  X-ray of the abdomen shows a moderate amount of stool burden, no  obstruction  ____________________________________________   PROCEDURES  Procedure(s) performed: No  Procedures    ____________________________________________   INITIAL IMPRESSION / ASSESSMENT AND PLAN / ED COURSE  Pertinent labs & imaging results that were available during my care of the patient were reviewed by me and considered in my medical decision making (see chart for details).   Patient is a 43 year old male presents emergency department with constipation.  See HPI  Physical exam shows patient to appear well.  Abdomen is nontender and bowel sounds are normal.  Patient is refusing a rectal exam  Explained findings to the patient.  X-ray did show a moderate amount of stool burden.  Patient is refusing any treatment.  Offered a glycerin suppository/enema patient is refusing at this time.  He states he can do that at home and does not have any pain at times he does not feel it is necessary.  He was given discharge instructions on constipation.  He was encouraged to buy a fleets enema and take MiraLAX daily until his bowel movements become normal.  He was discharged stable condition.    Johnny Russell was evaluated in Emergency Department on 08/03/2019 for the symptoms described in the history of present illness. He was evaluated in the context of the global COVID-19 pandemic, which necessitated consideration that the patient might be at risk for infection with the SARS-CoV-2 virus that causes COVID-19. Institutional protocols and algorithms that pertain to the evaluation of patients at risk for COVID-19 are in a state of rapid change based on information released by regulatory bodies including the CDC and federal and state organizations. These policies and algorithms were followed during the patient's care in the ED.   As part of my medical decision making, I reviewed the following data within the [place] notes reviewed and incorporated, Old chart  reviewed, Radiograph reviewed , Notes from prior ED visits and [place] Controlled Substance Database  ____________________________________________   FINAL CLINICAL IMPRESSION(S) / ED DIAGNOSES  Final diagnoses:  Constipation  Constipation, unspecified constipation type      NEW MEDICATIONS STARTED DURING THIS VISIT:  Current Discharge Medication List       Note:  This document was prepared using Dragon voice recognition software and may include unintentional dictation errors.    Versie Starks, PA-C 08/03/19 1655    Merlyn Lot, MD 08/03/19 [phone]

## 2019-08-03 NOTE — ED Triage Notes (Signed)
Pt reports difficulty having a BM since Saturday. Pt states that he tried at work today but it was so hard that he could only push a little out and then he had to break it off and push the rest back in him. Pt concerned about an impaction.

## 2019-08-03 NOTE — Discharge Instructions (Addendum)
Buy a Fleets enema to help loosen the stool, take over the counter ducuolax, also start taking Miralax daily, 1 tablespoon in a clear liquid of your choice once daily to help soften stools Return if worsening

## 2019-09-23 ENCOUNTER — Emergency Department
Admission: EM | Admit: 2019-09-23 | Discharge: 2019-09-23 | Discharge status: Against Medical Advice | Payer: Self-pay | Attending: Emergency Medicine | Admitting: Emergency Medicine

## 2019-09-23 ENCOUNTER — Encounter: Payer: Self-pay | Admitting: Emergency Medicine

## 2019-09-23 ENCOUNTER — Other Ambulatory Visit: Payer: Self-pay

## 2019-09-23 DIAGNOSIS — Z5321 Procedure and treatment not carried out due to patient leaving prior to being seen by health care provider: Secondary | ICD-10-CM | POA: Insufficient documentation

## 2019-09-23 DIAGNOSIS — K0889 Other specified disorders of teeth and supporting structures: Secondary | ICD-10-CM | POA: Insufficient documentation

## 2019-09-23 NOTE — ED Triage Notes (Signed)
Patient ambulatory to triage with steady gait, without difficulty or distress noted; pt reports awoke at 2am with rt upper dental pain

## 2019-09-23 NOTE — ED Notes (Signed)
Pt reports that he needs to be at work by 7am and unable to stay any longer; offered pt work note excuse but st unable to miss work and will try to come back later

## 2019-10-01 ENCOUNTER — Other Ambulatory Visit: Payer: Self-pay

## 2019-10-01 ENCOUNTER — Encounter: Payer: Self-pay | Admitting: Emergency Medicine

## 2019-10-01 ENCOUNTER — Emergency Department
Admission: EM | Admit: 2019-10-01 | Discharge: 2019-10-01 | Disposition: A | Discharge status: Home / Self Care | Payer: Self-pay | Attending: Emergency Medicine | Admitting: Emergency Medicine

## 2019-10-01 DIAGNOSIS — F17210 Nicotine dependence, cigarettes, uncomplicated: Secondary | ICD-10-CM | POA: Insufficient documentation

## 2019-10-01 DIAGNOSIS — K0889 Other specified disorders of teeth and supporting structures: Secondary | ICD-10-CM | POA: Insufficient documentation

## 2019-10-01 MED ORDER — LIDOCAINE VISCOUS HCL 2 % MT SOLN
15.0000 mL | Freq: Once | OROMUCOSAL | Status: AC
  Administered 2019-10-01: 10:00:00 15 mL via OROMUCOSAL
  Filled 2019-10-01: qty 15

## 2019-10-01 MED ORDER — AMOXICILLIN 875 MG PO TABS: 875.0000 mg | ORAL_TABLET | Freq: Two times a day (BID) | ORAL | 0 refills | Status: DC

## 2019-10-01 MED ORDER — LIDOCAINE-EPINEPHRINE 2 %-1:100000 IJ SOLN
1.7000 mL | Freq: Once | INTRAMUSCULAR | Status: AC
  Administered 2019-10-01: 1.7 mL
  Filled 2019-10-01: qty 1.7

## 2019-10-01 MED ORDER — IBUPROFEN 600 MG PO TABS: 600.0000 mg | ORAL_TABLET | Freq: Three times a day (TID) | ORAL | 0 refills | Status: DC | PRN

## 2019-10-01 NOTE — ED Notes (Signed)
See triage note  Presents with dental pain states he thinks he has an abscess   Min swelling noted

## 2019-10-01 NOTE — ED Triage Notes (Signed)
Right upper tooth pain.  Pain has been intermittent for the past several weeks, but worse today after biting down.

## 2019-10-01 NOTE — ED Provider Notes (Signed)
[facility] Emergency Department Provider Note   ____________________________________________   First MD Initiated Contact with Patient 10/01/19 [phone]     (approximate)  I have reviewed the triage vital signs and the nursing notes.   HISTORY  Chief Complaint Dental Pain   HPI Johnny Russell is a 44 y.o. male presents to the ED with complaint of dental pain.  Patient states that he feels that his tooth is abscessed.  Patient has had difficulty with his teeth for several years and has not seen a dentist most likely for financial reasons.  Patient denies any fever or chills.  Currently rates pain as a 10/10.       Past Medical History:  Diagnosis Date  . Abscess     There are no problems to display for this patient.   History reviewed. No pertinent surgical history.  Prior to Admission medications   Medication Sig Start Date End Date Taking? Authorizing Provider  amoxicillin (AMOXIL) 875 MG tablet Take 1 tablet (875 mg total) by mouth 2 (two) times daily. 10/01/19   Tommi Rumps, PA-C  ibuprofen (ADVIL) 600 MG tablet Take 1 tablet (600 mg total) by mouth every 8 (eight) hours as needed. 10/01/19   Tommi Rumps, PA-C  diphenhydrAMINE (BENADRYL) 25 mg capsule Take 1 capsule (25 mg total) by mouth every 4 (four) hours as needed. 03/17/18 08/03/19  Enid Derry, PA-C    Allergies Patient has no known allergies.  No family history on file.  Social History Social History   Tobacco Use  . Smoking status: Current Every Day Smoker    Packs/day: 1.00    Types: Cigarettes  . Smokeless tobacco: Never Used  Substance Use Topics  . Alcohol use: Yes    Comment: occ  . Drug use: Yes    Types: Marijuana    Comment: daily use x 3 joints    Review of Systems Constitutional: No fever/chills Eyes: No visual changes. ENT: Positive for dental pain. Cardiovascular: Denies chest pain. Respiratory: Denies shortness of  breath. Gastrointestinal: No abdominal pain.  No nausea, no vomiting.  No diarrhea.  No constipation. Musculoskeletal: Negative for back pain. Skin: Negative for rash. Neurological: Negative for headaches, focal weakness or numbness. ___________________________________________   PHYSICAL EXAM:  VITAL SIGNS: ED Triage Vitals  Enc Vitals Group     BP 10/01/19 0904 (!) 154/101     Pulse Rate 10/01/19 0904 87     Resp 10/01/19 0904 18     Temp 10/01/19 0904 98.1 F (36.7 C)     Temp Source 10/01/19 0904 Oral     SpO2 10/01/19 0904 98 %     Weight 10/01/19 0902 240 lb 1.3 oz (108.9 kg)     Height --      Head Circumference --      Peak Flow --      Pain Score 10/01/19 0902 10     Pain Loc --      Pain Edu? --      Excl. in GC? --     Constitutional: Alert and oriented. Well appearing and in no acute distress. Eyes: Conjunctivae are normal.  Head: Atraumatic. Nose: No congestion/rhinnorhea. Mouth/Throat: Mucous membranes are moist.  Oropharynx non-erythematous.  Patient has multiple dental cavities noted on the upper right side where his pain is greatest.  Teeth are in poor repair and his posterior molar is mostly eroded.  No obvious drainage or abscess noted however patient is extremely tender to  palpation. Neck: No stridor.   Hematological/Lymphatic/Immunilogical: No cervical lymphadenopathy. Cardiovascular: Normal rate, regular rhythm. Grossly normal heart sounds.  Good peripheral circulation. Respiratory: Normal respiratory effort.  No retractions. Lungs CTAB. Musculoskeletal: Moves upper and lower extremities that any difficulty.  Normal gait was noted. Neurologic:  Normal speech and language. No gross focal neurologic deficits are appreciated. No gait instability. Skin:  Skin is warm, dry and intact. No rash noted. Psychiatric: Mood and affect are normal. Speech and behavior are normal.  ____________________________________________   LABS (all labs ordered are listed,  but only abnormal results are displayed)  Labs Reviewed - No data to display  PROCEDURES  Procedure(s) performed (including Critical Care):  Dental Block  Date/Time: 10/01/2019 1:47 PM Performed by: Johnn Hai, PA-C Authorized by: Johnn Hai, PA-C   Consent:    Consent obtained:  Verbal   Consent given by:  Patient   Risks discussed:  Pain and infection Indications:    Indications: dental pain   Location:    Block type:  Anterior superior alveolar   Laterality:  Right Procedure details (see MAR for exact dosages):    Topical anesthetic:  Lidocaine gel   Syringe type:  Aspirating dental syringe   Needle gauge:  25 G   Anesthetic injected:  Lidocaine 2% WITH epi   Injection procedure:  Incremental injection, anatomic landmarks identified and anatomic landmarks palpated Post-procedure details:    Outcome:  Pain relieved   Patient tolerance of procedure:  Tolerated well, no immediate complications    ____________________________________________   INITIAL IMPRESSION / ASSESSMENT AND PLAN / ED COURSE  As part of my medical decision making, I reviewed the following data within the electronic MEDICAL RECORD NUMBER Notes from prior ED visits and [place] Controlled Substance Database  44 year old male presents to the ED with complaint of right upper dental pain that has been worse for the last 2 to 3 days.  Patient states that his tooth is been hurting for "a while".  Patient has not made a dental appointment.  He requested a dental block because the pain is so severe.  Right upper molar and premolar teeth are in very poor repair and markedly tender to palpation.  Dental block gave patient relief.  He is aware that he needs to pick up his antibiotic and take until completely finished.  He also has a prescription for ibuprofen as needed for pain.  He was given information about the dental clinics in the area and also a special free clinic that is in [place] should he wish to travel  there.  ____________________________________________   FINAL CLINICAL IMPRESSION(S) / ED DIAGNOSES  Final diagnoses:  Pain, dental     ED Discharge Orders         Ordered    amoxicillin (AMOXIL) 875 MG tablet  2 times daily     10/01/19 1051    ibuprofen (ADVIL) 600 MG tablet  Every 8 hours PRN     10/01/19 1051           Note:  This document was prepared using Dragon voice recognition software and may include unintentional dictation errors.    Johnn Hai, PA-C 10/01/19 1351    Drenda Freeze, MD 10/02/19 1330

## 2019-10-01 NOTE — Discharge Instructions (Addendum)
Begin taking the antibiotics as directed.  Also read the information about the free clinic from the [facility].  Also a list of dentist are listed on your discharge papers that charged per sliding scale.  OPTIONS FOR DENTAL FOLLOW UP CARE  [place] Department of Health and Human Services - Local Safety Net Dental Clinics [email or web address]   [facility] [phone])  [facility] [phone])  [facility] [phone] ext [phone])  [facility] [phone])  [facility] [phone]) This clinic caters to the indigent population and is on a lottery system. Location: [employer] of Dentistry, [employer], [address] [place] Clinic Hours: Wednesdays from 6pm - 9pm, patients seen by a lottery system. For dates, call or go to [email or web address] Services: Cleanings, fillings and simple extractions. Payment Options: DENTAL WORK IS FREE OF CHARGE. Bring proof of income or support. Best way to get seen: Arrive at 5:15 pm - this is a lottery, NOT first come/first serve, so arriving earlier will not increase your chances of being seen.     [facility] [phone] Select option 1 for emergencies   Location: [facility]istry, [place], [address], [place] Clinic Hours: No walk-ins accepted - call the day before to schedule an appointment. Check in times are 9:30 am and 1:30 pm. Services: Simple extractions, temporary fillings, pulpectomy/pulp debridement, uncomplicated abscess drainage. Payment Options: PAYMENT IS DUE AT THE TIME OF SERVICE.  Fee is usually $100-200, additional surgical procedures (e.g. abscess drainage) may be extra. Cash, checks, Visa/MasterCard accepted.  Can file Medicaid if patient is covered for dental - patient should call case worker to check. No discount for  [facility] patients. Best way to get seen: MUST call the day before and get onto the schedule. Can usually be seen the next 1-2 days. No walk-ins accepted.     [facility] [phone]   Location: [facility], [address] [place] Clinic Hours: M, W, Th, F 8am or 1:30pm, Tues 9a or 1:30 - first come/first served. Services: Simple extractions, temporary fillings, uncomplicated abscess drainage.  You do not need to be an [facility] resident. Payment Options: PAYMENT IS DUE AT THE TIME OF SERVICE. Dental insurance, otherwise sliding scale - bring proof of income or support. Depending on income and treatment needed, cost is usually $50-200. Best way to get seen: Arrive early as it is first come/first served.     [facility] [phone]   Location: [address] Clinic Hours: Mon-Thu 8a-5p Services: Most basic dental services including extractions and fillings. Payment Options: PAYMENT IS DUE AT THE TIME OF SERVICE. Sliding scale, up to 50% off - bring proof if income or support. Medicaid with dental option accepted. Best way to get seen: Call to schedule an appointment, can usually be seen within 2 weeks OR they will try to see walk-ins - show up at 8a or 2p (you may have to wait).     [facility] [phone] [place] RESIDENTS ONLY   Location: [facility], [address], [place], [place] [address] Clinic Hours: By appointment only. Monday - Thursday 8am-5pm, Friday 8am-12pm Services: Cleanings, fillings, extractions. Payment Options: PAYMENT IS DUE AT THE TIME OF SERVICE. Cash, Visa or MasterCard. Sliding scale - $30 minimum per service. Best way to get seen: Come in to office, complete packet and make an appointment - need proof  of income or support monies for each household member and proof of [facility] residence. Usually takes  about a month to get in.     [facility] [phone]   Location: [address], [place] Clinic Hours: Walk-in Urgent Care Dental Services are offered Monday-Friday mornings only. The numbers of emergencies accepted daily is limited to the number of providers available. Maximum 15 - Mondays, Wednesdays & Thursdays Maximum 10 - Tuesdays & Fridays Services: You do not need to be a [facility] resident to be seen for a dental emergency. Emergencies are defined as pain, swelling, abnormal bleeding, or dental trauma. Walkins will receive x-rays if needed. NOTE: Dental cleaning is not an emergency. Payment Options: PAYMENT IS DUE AT THE TIME OF SERVICE. Minimum co-pay is $40.00 for uninsured patients. Minimum co-pay is $3.00 for Medicaid with dental coverage. Dental Insurance is accepted and must be presented at time of visit. Medicare does not cover dental. Forms of payment: Cash, credit card, checks. Best way to get seen: If not previously registered with the clinic, walk-in dental registration begins at 7:15 am and is on a first come/first serve basis. If previously registered with the clinic, call to make an appointment.     [facility] [place] ONLY   Location: [address] [place], [place] Clinic Hours: Mon-Thu 10a-2p Services: Extractions only! Payment Options: FREE (donations accepted) - bring proof of income or support Best way to get seen: Call and schedule an appointment OR come at 8am on the 1st Monday of every month (except for holidays) when it is first come/first served.     [facility] [phone]   Location: [place], [place] Clinic Hours: Friday mornings Services, Payment Options, Best way to get seen: Call for info

## 2019-12-14 ENCOUNTER — Emergency Department: Payer: Self-pay

## 2019-12-14 ENCOUNTER — Other Ambulatory Visit: Payer: Self-pay

## 2019-12-14 ENCOUNTER — Emergency Department
Admission: EM | Admit: 2019-12-14 | Discharge: 2019-12-14 | Disposition: A | Discharge status: Home / Self Care | Payer: Self-pay | Attending: Emergency Medicine | Admitting: Emergency Medicine

## 2019-12-14 ENCOUNTER — Encounter: Payer: Self-pay | Admitting: Emergency Medicine

## 2019-12-14 DIAGNOSIS — J45909 Unspecified asthma, uncomplicated: Secondary | ICD-10-CM | POA: Insufficient documentation

## 2019-12-14 DIAGNOSIS — R0602 Shortness of breath: Secondary | ICD-10-CM

## 2019-12-14 DIAGNOSIS — F17210 Nicotine dependence, cigarettes, uncomplicated: Secondary | ICD-10-CM | POA: Insufficient documentation

## 2019-12-14 LAB — BASIC METABOLIC PANEL
Anion gap: 10 (ref 5–15)
BUN: 16 mg/dL (ref 6–20)
CO2: 24 mmol/L (ref 22–32)
Calcium: 9.3 mg/dL (ref 8.9–10.3)
Chloride: 104 mmol/L (ref 98–111)
Creatinine, Ser: 1.32 mg/dL — ABNORMAL HIGH (ref 0.61–1.24)
GFR calc Af Amer: >60 mL/min (ref >60)
GFR calc non Af Amer: >60 mL/min (ref >60)
Glucose, Bld: 117 mg/dL — ABNORMAL HIGH (ref 70–99)
Potassium: 4 mmol/L (ref 3.5–5.1)
Sodium: 138 mmol/L (ref 135–145)

## 2019-12-14 LAB — CBC
HCT: 49.8 % (ref 39.0–52.0)
Hemoglobin: 17.4 g/dL — ABNORMAL HIGH (ref 13.0–17.0)
MCH: 32.1 pg (ref 26.0–34.0)
MCHC: 34.9 g/dL (ref 30.0–36.0)
MCV: 91.9 fL (ref 80.0–100.0)
Platelets: 241 10*3/uL (ref 150–400)
RBC: 5.42 MIL/uL (ref 4.22–5.81)
RDW: 12.2 % (ref 11.5–15.5)
WBC: 11.7 10*3/uL — ABNORMAL HIGH (ref 4.0–10.5)
nRBC: 0 % (ref 0.0–0.2)

## 2019-12-14 LAB — TROPONIN I (HIGH SENSITIVITY): Troponin I (High Sensitivity): 6 ng/L (ref <18)

## 2019-12-14 MED ORDER — PREDNISONE 10 MG PO TABS: 10.0000 mg | ORAL_TABLET | Freq: Every day | ORAL | 0 refills | Status: DC

## 2019-12-14 MED ORDER — ALBUTEROL SULFATE HFA 108 (90 BASE) MCG/ACT IN AERS: 2.0000 | INHALATION_SPRAY | Freq: Four times a day (QID) | RESPIRATORY_TRACT | 1 refills | Status: AC | PRN

## 2019-12-14 MED ORDER — IPRATROPIUM-ALBUTEROL 0.5-2.5 (3) MG/3ML IN SOLN
3.0000 mL | Freq: Once | RESPIRATORY_TRACT | Status: AC
  Administered 2019-12-14: 19:00:00 3 mL via RESPIRATORY_TRACT
  Filled 2019-12-14: qty 3

## 2019-12-14 NOTE — ED Triage Notes (Signed)
Pt arrived via POV with reports of worsening shortness of breath and chest congestion.  Pt states he has been taking Claritin daily, states he has hx of seasonal allergies, but states today the shortness of breath got worse.  Pt denies any nasal congestion, but c/o runny nose.

## 2019-12-14 NOTE — ED Notes (Signed)
See triage note  Presents with some SOB and congestion   Sl wheezing noted

## 2019-12-14 NOTE — ED Provider Notes (Signed)
[facility] Emergency Department Provider Note  Time seen: 6:34 PM  I have reviewed the triage vital signs and the nursing notes.   HISTORY  Chief Complaint Shortness of Breath    HPI Johnny Russell is a 44 y.o. male with a past medical history of seasonal allergies presents to the emergency department for shortness of breath.  According to the patient for the past several weeks to months he has been feeling short of breath although states over the past 1 week it has gotten worse.  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  Patient states he has had to use an inhaler previously for shortness of breath but has never been diagnosed with asthma.  Patient is a daily smoker but has no COPD diagnosis.  Patient denies any chest pain states some tightness feels like he has trouble catching his breath especially when he is exerting himself.   Past Medical History:  Diagnosis Date  . Abscess     There are no problems to display for this patient.   History reviewed. No pertinent surgical history.  Prior to Admission medications   Medication Sig Start Date End Date Taking? Authorizing Provider  amoxicillin (AMOXIL) 875 MG tablet Take 1 tablet (875 mg total) by mouth 2 (two) times daily. 10/01/19   Johnny Hai, PA-C  ibuprofen (ADVIL) 600 MG tablet Take 1 tablet (600 mg total) by mouth every 8 (eight) hours as needed. 10/01/19   Johnny Hai, PA-C  diphenhydrAMINE (BENADRYL) 25 mg capsule Take 1 capsule (25 mg total) by mouth every 4 (four) hours as needed. 03/17/18 08/03/19  Laban Emperor, PA-C    No Known Allergies  History reviewed. No pertinent family history.  Social History Social History   Tobacco Use  . Smoking status: Current Every Day Smoker    Packs/day: 1.00    Types: Cigarettes  . Smokeless tobacco: Never Used  Substance Use Topics  . Alcohol use: Yes    Comment:  occ  . 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 Cardiovascular: Negative for chest pain. Respiratory: Shortness of breath over the last several months worse over the past 1 week. Gastrointestinal: Negative for abdominal pain Genitourinary: Negative for urinary compaints Musculoskeletal: Negative for musculoskeletal complaints Neurological: Negative for headache All other ROS negative  ____________________________________________   PHYSICAL EXAM:  VITAL SIGNS: ED Triage Vitals  Enc Vitals Group     BP 12/14/19 1530 (!) 137/118     Pulse Rate 12/14/19 1530 (!) 112     Resp 12/14/19 1530 (!) 22     Temp 12/14/19 1530 98.5 F (36.9 C)     Temp Source 12/14/19 1530 Oral     SpO2 12/14/19 1530 97 %     Weight 12/14/19 1531 220 lb (99.8 kg)     Height 12/14/19 1531 5\' 10"  (1.778 m)     Head Circumference --      Peak Flow --      Pain Score 12/14/19 1531 8     Pain Loc --      Pain Edu? --      Excl. in [place]? --    Constitutional: Alert and oriented. Well appearing and in no distress. Eyes: Normal exam ENT      Head: Normocephalic and atraumatic.  Mouth/Throat: Mucous membranes are moist. Cardiovascular: Normal rate, regular rhythm. No murmur Respiratory: Normal respiratory effort without tachypnea nor retractions.  Gastrointestinal: Soft and nontender. No distention.   Musculoskeletal: Nontender with normal range of motion in all extremities. Neurologic:  Normal speech and language. No gross focal neurologic deficits  Skin:  Skin is warm, dry and intact.  Psychiatric: Mood and affect are normal.   ____________________________________________    EKG  EKG viewed and interpreted by myself shows a normal sinus rhythm at 98 bpm with a narrow QRS, normal axis, normal intervals, no concerning ST changes.  ____________________________________________    RADIOLOGY  Chest x-ray is  negative  ____________________________________________   INITIAL IMPRESSION / ASSESSMENT AND PLAN / ED COURSE  Pertinent labs & imaging results that were available during my care of the patient were reviewed by me and considered in my medical decision making (see chart for details).   Patient presents to the emergency department for shortness of breath worse over the past week.  Patient has clear lung sounds, normal chest x-ray, reassuring lab work including a negative troponin.  Overall the patient appears well I suspect likely possible chronic bronchitis/emphysema in addition to seasonal allergies.  We will place on a prednisone taper and an albuterol inhaler.  Patient agreeable to plan of care.  Johnny Russell was evaluated in Emergency Department on 12/14/2019 for the symptoms described in the history of present illness. He was evaluated in the context of the global COVID-19 pandemic, which necessitated consideration that the patient might be at risk for infection with the SARS-CoV-2 virus that causes COVID-19. Institutional protocols and algorithms that pertain to the evaluation of patients at risk for COVID-19 are in a state of rapid change based on information released by regulatory bodies including the CDC and federal and state organizations. These policies and algorithms were followed during the patient's care in the ED.  ____________________________________________   FINAL CLINICAL IMPRESSION(S) / ED DIAGNOSES  Dyspnea   Minna Antis, MD 12/14/19 2051

## 2022-01-22 ENCOUNTER — Emergency Department: Payer: Self-pay

## 2022-01-22 ENCOUNTER — Other Ambulatory Visit: Payer: Self-pay

## 2022-01-22 ENCOUNTER — Emergency Department
Admission: EM | Admit: 2022-01-22 | Discharge: 2022-01-22 | Disposition: A | Discharge status: Home / Self Care | Payer: Self-pay | Attending: Emergency Medicine | Admitting: Emergency Medicine

## 2022-01-22 DIAGNOSIS — R1032 Left lower quadrant pain: Secondary | ICD-10-CM | POA: Insufficient documentation

## 2022-01-22 DIAGNOSIS — R1031 Right lower quadrant pain: Secondary | ICD-10-CM | POA: Insufficient documentation

## 2022-01-22 LAB — URINALYSIS, ROUTINE W REFLEX MICROSCOPIC
Bilirubin Urine: NEGATIVE
Glucose, UA: NEGATIVE mg/dL
Hgb urine dipstick: NEGATIVE
Ketones, ur: NEGATIVE mg/dL
Leukocytes,Ua: NEGATIVE
Nitrite: NEGATIVE
Protein, ur: NEGATIVE mg/dL
Specific Gravity, Urine: 1.018 (ref 1.005–1.030)
pH: 5 (ref 5.0–8.0)

## 2022-01-22 LAB — COMPREHENSIVE METABOLIC PANEL
ALT: 31 U/L (ref 0–44)
AST: 25 U/L (ref 15–41)
Albumin: 4.2 g/dL (ref 3.5–5.0)
Alkaline Phosphatase: 92 U/L (ref 38–126)
Anion gap: 7 (ref 5–15)
BUN: 10 mg/dL (ref 6–20)
CO2: 27 mmol/L (ref 22–32)
Calcium: 9.1 mg/dL (ref 8.9–10.3)
Chloride: 105 mmol/L (ref 98–111)
Creatinine, Ser: 0.94 mg/dL (ref 0.61–1.24)
GFR, Estimated: >60 mL/min (ref >60)
Glucose, Bld: 100 mg/dL — ABNORMAL HIGH (ref 70–99)
Potassium: 4.3 mmol/L (ref 3.5–5.1)
Sodium: 139 mmol/L (ref 135–145)
Total Bilirubin: 0.6 mg/dL (ref 0.3–1.2)
Total Protein: 7.5 g/dL (ref 6.5–8.1)

## 2022-01-22 LAB — CBC
HCT: 49.4 % (ref 39.0–52.0)
Hemoglobin: 16.8 g/dL (ref 13.0–17.0)
MCH: 31.7 pg (ref 26.0–34.0)
MCHC: 34 g/dL (ref 30.0–36.0)
MCV: 93.2 fL (ref 80.0–100.0)
Platelets: 245 10*3/uL (ref 150–400)
RBC: 5.3 MIL/uL (ref 4.22–5.81)
RDW: 12.6 % (ref 11.5–15.5)
WBC: 8.3 10*3/uL (ref 4.0–10.5)
nRBC: 0 % (ref 0.0–0.2)

## 2022-01-22 LAB — LIPASE, BLOOD: Lipase: 37 U/L (ref 11–51)

## 2022-01-22 MED ORDER — SODIUM CHLORIDE 0.9 % IV BOLUS
1000.0000 mL | Freq: Once | INTRAVENOUS | Status: AC
  Administered 2022-01-22: 1000 mL via INTRAVENOUS

## 2022-01-22 MED ORDER — IOHEXOL 300 MG/ML  SOLN
100.0000 mL | Freq: Once | INTRAMUSCULAR | Status: AC | PRN
  Administered 2022-01-22: 100 mL via INTRAVENOUS

## 2022-01-22 NOTE — ED Triage Notes (Signed)
Pt c/o lower abd pain states has pain like he needs to use the BR but even after he goes it doesn't seem to get better, denies N/V/D.Johnny Russell

## 2022-01-22 NOTE — ED Provider Notes (Signed)
[facility] Provider Note    Event Date/Time   First MD Initiated Contact with Patient 01/22/22 [phone]     (approximate)   History   Chief Complaint Abdominal Pain   HPI  Johnny Russell is a 46 y.o. male with no significant past medical history who presents to the ED complaining of abdominal pain.  Patient reports that he has had about 5 days of gradually worsening pain in the bilateral lower quadrants of his abdomen, left greater than right.  He describes the pain as a dull ache, similar to if he were needing to have a bowel movement, but states pain does not improve when he does have a bowel movement.  His bowel movements have been normal and he denies any nausea, vomiting, dysuria, hematuria, or flank pain.  He denies similar symptoms in the past and has never had surgery on his abdomen.      Physical Exam   Triage Vital Signs: ED Triage Vitals  Enc Vitals Group     BP 01/22/22 0733 (!) 147/88     Pulse Rate 01/22/22 0733 72     Resp 01/22/22 0733 18     Temp 01/22/22 0733 98.2 F (36.8 C)     Temp Source 01/22/22 0733 Oral     SpO2 01/22/22 0733 96 %     Weight 01/22/22 0749 220 lb 0.3 oz (99.8 kg)     Height 01/22/22 0749 5\' 10"  (1.778 m)     Head Circumference --      Peak Flow --      Pain Score 01/22/22 0729 8     Pain Loc --      Pain Edu? --      Excl. in [place]? --     Most recent vital signs: Vitals:   01/22/22 0733  BP: (!) 147/88  Pulse: 72  Resp: 18  Temp: 98.2 F (36.8 C)  SpO2: 96%    Constitutional: Alert and oriented. Eyes: Conjunctivae are normal. Head: Atraumatic. Nose: No congestion/rhinnorhea. Mouth/Throat: Mucous membranes are moist.  Cardiovascular: Normal rate, regular rhythm. Grossly normal heart sounds.  2+ radial pulses bilaterally. Respiratory: Normal respiratory effort.  No retractions. Lungs CTAB. Gastrointestinal: Soft and tender to palpation in the lower quadrants, left greater than right.  No  CVA tenderness bilaterally.  No distention. Musculoskeletal: No lower extremity tenderness nor edema.  Neurologic:  Normal speech and language. No gross focal neurologic deficits are appreciated.    ED Results / Procedures / Treatments   Labs (all labs ordered are listed, but only abnormal results are displayed) Labs Reviewed  COMPREHENSIVE METABOLIC PANEL - Abnormal; Notable for the following components:      Result Value   Glucose, Bld 100 (*)    All other components within normal limits  URINALYSIS, ROUTINE W REFLEX MICROSCOPIC - Abnormal; Notable for the following components:   Color, Urine YELLOW (*)    APPearance CLEAR (*)    All other components within normal limits  LIPASE, BLOOD  CBC    RADIOLOGY CT of abdomen/pelvis reviewed and interpreted by me with no inflammatory changes, focal fluid collections, or dilated bowel loops.  PROCEDURES:  Critical Care performed: No  Procedures   MEDICATIONS ORDERED IN ED: Medications  sodium chloride 0.9 % bolus 1,000 mL (1,000 mLs Intravenous New Bag/Given 01/22/22 0900)  iohexol (OMNIPAQUE) 300 MG/ML solution 100 mL (100 mLs Intravenous Contrast Given 01/22/22 0907)     IMPRESSION / MDM / ASSESSMENT AND  PLAN / ED COURSE  I reviewed the triage vital signs and the nursing notes.                              46 y.o. male with no significant past medical history who presents to the ED with 5 days of gradually worsening lower abdominal pain, left greater than right, feeling similar to when he needs to have a bowel movement.  Differential diagnosis includes, but is not limited to, diverticulitis, intra-abdominal abscess, appendicitis, colitis, UTI, kidney stone.  Patient well-appearing and in no acute distress, vital signs are unremarkable.  Initial labs are reassuring with CBC showing no anemia or leukocytosis, BMP without electrolyte abnormality or AKI.  LFTs and lipase are within normal limits, urinalysis does not appear  consistent with infection.  We will further assess with CT scan for diverticulitis and appendicitis, patient declines symptomatic treatment at this time.  CT scan is unremarkable, no evidence of diverticulitis or appendicitis.  Patient continues to state that pain is manageable at this time and he is appropriate for discharge home with outpatient follow-up.  He was counseled on over-the-counter management and counseled to establish care with PCP.  Patient agrees to return to the ED for any new or worsening symptoms.      FINAL CLINICAL IMPRESSION(S) / ED DIAGNOSES   Final diagnoses:  Left lower quadrant abdominal pain     Rx / DC Orders   ED Discharge Orders     None        Note:  This document was prepared using Dragon voice recognition software and may include unintentional dictation errors.   Chesley Noon, MD 01/22/22 1017

## 2022-02-26 ENCOUNTER — Telehealth: Payer: Self-pay

## 2022-02-26 NOTE — Telephone Encounter (Signed)
Calling per an ED referral to see if patient is interesting in establishing care at [facility]; voice mail box full

## 2022-05-08 ENCOUNTER — Emergency Department
Admission: EM | Admit: 2022-05-08 | Discharge: 2022-05-08 | Disposition: A | Discharge status: Home / Self Care | Payer: Medicaid Other | Attending: Emergency Medicine | Admitting: Emergency Medicine

## 2022-05-08 ENCOUNTER — Encounter: Payer: Self-pay | Admitting: Emergency Medicine

## 2022-05-08 ENCOUNTER — Other Ambulatory Visit: Payer: Self-pay

## 2022-05-08 DIAGNOSIS — S50862A Insect bite (nonvenomous) of left forearm, initial encounter: Secondary | ICD-10-CM | POA: Insufficient documentation

## 2022-05-08 DIAGNOSIS — S80861A Insect bite (nonvenomous), right lower leg, initial encounter: Secondary | ICD-10-CM | POA: Insufficient documentation

## 2022-05-08 DIAGNOSIS — W57XXXA Bitten or stung by nonvenomous insect and other nonvenomous arthropods, initial encounter: Secondary | ICD-10-CM | POA: Insufficient documentation

## 2022-05-08 DIAGNOSIS — S50861A Insect bite (nonvenomous) of right forearm, initial encounter: Secondary | ICD-10-CM | POA: Insufficient documentation

## 2022-05-08 DIAGNOSIS — S80862A Insect bite (nonvenomous), left lower leg, initial encounter: Secondary | ICD-10-CM | POA: Insufficient documentation

## 2022-05-08 MED ORDER — PREDNISONE 20 MG PO TABS: 40.0000 mg | ORAL_TABLET | Freq: Every day | ORAL | 0 refills | Status: AC

## 2022-05-08 NOTE — Discharge Instructions (Signed)
Take the steroids to try to help with the inflammation and continued cortisone creams and recheck your house to make sure no additional bedbugs return to the ER if develop worsening symptoms or any other concerns.  Also follow-up with [place] derm  [facility] 3.6 [phone])  Dermatologist [place], [place]  [phone] Open ? Closes 5?PM

## 2022-05-08 NOTE — ED Provider Notes (Signed)
   [facility] Provider Note    Event Date/Time   First MD Initiated Contact with Patient 05/08/22 1038     (approximate)   History   Rash   HPI  Johnny Russell is a 46 y.o. male who comes in with rash on his bilateral forearms and a little bit on his legs.  He does report having an issue with bedbugs.  He reports that he saw the bedbugs that he is cleaned all of his mattresses and has not seen any recently but the rash is still been there for 3 weeks even with trying to use the hydrocortisone creams.  He denies any fevers, lesions on the palms of his hands or feet inside of his mouth or genitals or any other concerns.  He has no lesions in between his fingers.  He denies having any thorn pricks.  Physical Exam   Triage Vital Signs: ED Triage Vitals [05/08/22 0919]  Enc Vitals Group     BP 139/89     Pulse Rate 93     Resp 16     Temp 97.8 F (36.6 C)     Temp Source Oral     SpO2 95 %     Weight 230 lb (104.3 kg)     Height 5\' 10"  (1.778 m)     Head Circumference      Peak Flow      Pain Score 6     Pain Loc      Pain Edu?      Excl. in [place]?     Most recent vital signs: Vitals:   05/08/22 0919  BP: 139/89  Pulse: 93  Resp: 16  Temp: 97.8 F (36.6 C)  SpO2: 95%     General: Awake, no distress.  CV:  Good peripheral perfusion.  Resp:  Normal effort.  Abd:  No distention.  Other:  Patient has scattered rash noted mostly on the backs of his bilateral arms that looks consistent with bug bites.  There is no significant erythema   ED Results / Procedures / Treatments   Lab  IMPRESSION / MDM / ASSESSMENT AND PLAN / ED COURSE  I reviewed the triage vital signs and the nursing notes.   Patient's presentation is most consistent with acute, uncomplicated illness.   Exam most consistent with bedbug bites.  Discussed that patient previously was put on permethrin and he reports that he never used this and that he knows for fact that  is not scabies.  I do agree that exam seems more consistent with bedbug bites and given is not getting better with the creams will prescribe a course of steroids.  Patient expressed understanding.  No evidence of SJS or more serious infection.  I reviewed the ER note from 03/17/2018  Final diagnoses:  Bedbug bite, initial encounter     Rx / DC Orders   ED Discharge Orders          Ordered    predniSONE (DELTASONE) 20 MG tablet  Daily with breakfast        05/08/22 1105             Note:  This document was prepared using Dragon voice recognition software and may include unintentional dictation errors.   07/08/22, MD 05/08/22 1105

## 2022-05-08 NOTE — ED Triage Notes (Signed)
Pt to ED via POV stating that he has had a rash on his arm for about 3 weeks. Pt states that the rash is getting more painful and that it is "getting infection in it". Pt is in NAD.

## 2022-05-08 NOTE — ED Notes (Signed)
Dc instructions and scripts reviewed with pt no questions or concerns at this time.  

## 2022-08-03 ENCOUNTER — Emergency Department
Admission: EM | Admit: 2022-08-03 | Discharge: 2022-08-03 | Disposition: A | Discharge status: Home / Self Care | Payer: Medicaid Other | Attending: Emergency Medicine | Admitting: Emergency Medicine

## 2022-08-03 ENCOUNTER — Emergency Department: Payer: Medicaid Other

## 2022-08-03 ENCOUNTER — Encounter: Payer: Self-pay | Admitting: Emergency Medicine

## 2022-08-03 ENCOUNTER — Other Ambulatory Visit: Payer: Self-pay

## 2022-08-03 DIAGNOSIS — N410 Acute prostatitis: Secondary | ICD-10-CM | POA: Insufficient documentation

## 2022-08-03 DIAGNOSIS — Z1152 Encounter for screening for COVID-19: Secondary | ICD-10-CM | POA: Insufficient documentation

## 2022-08-03 DIAGNOSIS — R1031 Right lower quadrant pain: Secondary | ICD-10-CM | POA: Diagnosis present

## 2022-08-03 DIAGNOSIS — R1030 Lower abdominal pain, unspecified: Secondary | ICD-10-CM

## 2022-08-03 HISTORY — DX: Calculus of kidney: N20.0

## 2022-08-03 LAB — HEPATIC FUNCTION PANEL
ALT: 24 U/L (ref 0–44)
AST: 29 U/L (ref 15–41)
Albumin: 4.1 g/dL (ref 3.5–5.0)
Alkaline Phosphatase: 76 U/L (ref 38–126)
Bilirubin, Direct: <0.1 mg/dL (ref 0.0–0.2)
Total Bilirubin: 0.7 mg/dL (ref 0.3–1.2)
Total Protein: 7.4 g/dL (ref 6.5–8.1)

## 2022-08-03 LAB — URINALYSIS, ROUTINE W REFLEX MICROSCOPIC
Bacteria, UA: NONE SEEN
Bilirubin Urine: NEGATIVE
Glucose, UA: NEGATIVE mg/dL
Hgb urine dipstick: NEGATIVE
Ketones, ur: NEGATIVE mg/dL
Nitrite: NEGATIVE
Protein, ur: NEGATIVE mg/dL
Specific Gravity, Urine: 1.025 (ref 1.005–1.030)
Squamous Epithelial / HPF: NONE SEEN (ref 0–5)
pH: 5 (ref 5.0–8.0)

## 2022-08-03 LAB — BASIC METABOLIC PANEL
Anion gap: 7 (ref 5–15)
BUN: 13 mg/dL (ref 6–20)
CO2: 25 mmol/L (ref 22–32)
Calcium: 9.2 mg/dL (ref 8.9–10.3)
Chloride: 106 mmol/L (ref 98–111)
Creatinine, Ser: 0.98 mg/dL (ref 0.61–1.24)
GFR, Estimated: >60 mL/min (ref >60)
Glucose, Bld: 186 mg/dL — ABNORMAL HIGH (ref 70–99)
Potassium: 4.1 mmol/L (ref 3.5–5.1)
Sodium: 138 mmol/L (ref 135–145)

## 2022-08-03 LAB — CBC WITH DIFFERENTIAL/PLATELET
Abs Immature Granulocytes: 0.06 10*3/uL (ref 0.00–0.07)
Basophils Absolute: 0.1 10*3/uL (ref 0.0–0.1)
Basophils Relative: 1 %
Eosinophils Absolute: 0.4 10*3/uL (ref 0.0–0.5)
Eosinophils Relative: 4 %
HCT: 47.7 % (ref 39.0–52.0)
Hemoglobin: 16.3 g/dL (ref 13.0–17.0)
Immature Granulocytes: 1 %
Lymphocytes Relative: 29 %
Lymphs Abs: 2.7 10*3/uL (ref 0.7–4.0)
MCH: 31.8 pg (ref 26.0–34.0)
MCHC: 34.2 g/dL (ref 30.0–36.0)
MCV: 93.2 fL (ref 80.0–100.0)
Monocytes Absolute: 0.7 10*3/uL (ref 0.1–1.0)
Monocytes Relative: 7 %
Neutro Abs: 5.4 10*3/uL (ref 1.7–7.7)
Neutrophils Relative %: 58 %
Platelets: 229 10*3/uL (ref 150–400)
RBC: 5.12 MIL/uL (ref 4.22–5.81)
RDW: 12.4 % (ref 11.5–15.5)
WBC: 9.2 10*3/uL (ref 4.0–10.5)
nRBC: 0 % (ref 0.0–0.2)

## 2022-08-03 LAB — RESP PANEL BY RT-PCR (FLU A&B, COVID) ARPGX2
Influenza A by PCR: NEGATIVE
Influenza B by PCR: NEGATIVE
SARS Coronavirus 2 by RT PCR: NEGATIVE

## 2022-08-03 LAB — CHLAMYDIA/NGC RT PCR (ARMC ONLY)
Chlamydia Tr: NOT DETECTED
N gonorrhoeae: NOT DETECTED

## 2022-08-03 LAB — LIPASE, BLOOD: Lipase: 36 U/L (ref 11–51)

## 2022-08-03 MED ORDER — OXYCODONE-ACETAMINOPHEN 5-325 MG PO TABS
1.0000 | ORAL_TABLET | Freq: Once | ORAL | Status: AC
  Administered 2022-08-03: 1 via ORAL
  Filled 2022-08-03: qty 1

## 2022-08-03 MED ORDER — SULFAMETHOXAZOLE-TRIMETHOPRIM 800-160 MG PO TABS: 1.0000 | ORAL_TABLET | Freq: Two times a day (BID) | ORAL | 0 refills | Status: AC

## 2022-08-03 NOTE — ED Triage Notes (Signed)
C/O FEVER AND CHILLS 3am night before last. Now pain in groin. Fell 1 week ago. No OTC meds. HX of kidney stones. Urinating ok. No blood.

## 2022-08-03 NOTE — ED Provider Notes (Signed)
[facility] Provider Note    Event Date/Time   First MD Initiated Contact with Patient 08/03/22 1052     (approximate)   History   Chief Complaint Abdominal Pain (Feels like "kicked in the groin")   HPI  Johnny Russell is a 46 y.o. male with past medical history of kidney stones who presents to the ED complaining of abdominal pain.  Patient reports that he has been dealing with 2 days of constant pain in both sides of his lower abdomen that feels like it moves towards his groin.  He describes the pain as constant and sharp, denies any dysuria, hematuria, or flank pain.  He has not noticed any redness or swelling in his groin.  He denies any changes in his bowel movements and has not had any nausea or vomiting.  He does report fever yesterday with some general malaise, denies any cough, chest pain, or shortness of breath.  He has not had any Tylenol or ibuprofen today.     Physical Exam   Triage Vital Signs: ED Triage Vitals  Enc Vitals Group     BP 08/03/22 0845 (!) 151/90     Pulse Rate 08/03/22 0845 85     Resp 08/03/22 0845 18     Temp 08/03/22 0845 98.4 F (36.9 C)     Temp Source 08/03/22 0845 Oral     SpO2 08/03/22 0845 97 %     Weight 08/03/22 0846 250 lb (113.4 kg)     Height 08/03/22 0846 5\' 10"  (1.778 m)     Head Circumference --      Peak Flow --      Pain Score 08/03/22 0845 8     Pain Loc --      Pain Edu? --      Excl. in [place]? --     Most recent vital signs: Vitals:   08/03/22 0845  BP: (!) 151/90  Pulse: 85  Resp: 18  Temp: 98.4 F (36.9 C)  SpO2: 97%    Constitutional: Alert and oriented. Eyes: Conjunctivae are normal. Head: Atraumatic. Nose: No congestion/rhinnorhea. Mouth/Throat: Mucous membranes are moist.  Cardiovascular: Normal rate, regular rhythm. Grossly normal heart sounds.  2+ radial pulses bilaterally. Respiratory: Normal respiratory effort.  No retractions. Lungs CTAB. Gastrointestinal: Soft and  nontender. No distention.  Rectal exam with enlarged and tender prostate. Genitourinary: No testicular edema, erythema, or tenderness. Musculoskeletal: No lower extremity tenderness nor edema.  Neurologic:  Normal speech and language. No gross focal neurologic deficits are appreciated.    ED Results / Procedures / Treatments   Labs (all labs ordered are listed, but only abnormal results are displayed) Labs Reviewed  URINALYSIS, ROUTINE W REFLEX MICROSCOPIC - Abnormal; Notable for the following components:      Result Value   Color, Urine YELLOW (*)    APPearance HAZY (*)    Leukocytes,Ua MODERATE (*)    All other components within normal limits  BASIC METABOLIC PANEL - Abnormal; Notable for the following components:   Glucose, Bld 186 (*)    All other components within normal limits  RESP PANEL BY RT-PCR (FLU A&B, COVID) ARPGX2  URINE CULTURE  CHLAMYDIA/NGC RT PCR ([facility] ONLY)            CBC WITH DIFFERENTIAL/PLATELET  HEPATIC FUNCTION PANEL  LIPASE, BLOOD   RADIOLOGY CT renal protocol reviewed and interpreted by me with no obstructing stone, inflammatory changes, or dilated bowel loops noted.  PROCEDURES:  Critical  Care performed: No  Procedures   MEDICATIONS ORDERED IN ED: Medications  oxyCODONE-acetaminophen (PERCOCET/ROXICET) 5-325 MG per tablet 1 tablet (1 tablet Oral Given 08/03/22 0856)     IMPRESSION / MDM / ASSESSMENT AND PLAN / ED COURSE  I reviewed the triage vital signs and the nursing notes.                              46 y.o. male with past medical history of kidney stones who presents to the ED complaining of bilateral lower quadrant abdominal pain moving towards his groin for the past 2 days with fever yesterday.  Patient's presentation is most consistent with acute presentation with potential threat to life or bodily function.  Differential diagnosis includes, but is not limited to, kidney stone, pyelonephritis, cystitis, prostatitis, epididymitis,  torsion, diverticulitis, appendicitis.  Patient well-appearing and in no acute distress, vital signs are unremarkable.  He has a benign abdominal exam and testicular exam is without tenderness.  No findings to suggest epididymitis or torsion, although urinalysis does appear concerning for infection.  Rectal exam with enlarged and tender prostate, given urinalysis findings I am concerned for prostatitis.  Patient reports he is sexually active with only 1 partner and I have low suspicion for sexually transmitted infection, but will add on testing for GC and chlamydia.  CT imaging is negative for acute process, no evidence of kidney stone.  Labs are reassuring with no significant anemia, leukocytosis, electrolyte abnormality, or AKI.  LFTs and lipase are unremarkable, patient appropriate for outpatient management with Bactrim for suspected prostatitis.  He was provided referral to follow-up with urology and counseled to return to the ED for new or worsening symptoms.  Patient agrees with plan.      FINAL CLINICAL IMPRESSION(S) / ED DIAGNOSES   Final diagnoses:  Acute prostatitis  Lower abdominal pain     Rx / DC Orders   ED Discharge Orders          Ordered    sulfamethoxazole-trimethoprim (BACTRIM DS) 800-160 MG tablet  2 times daily        08/03/22 1228             Note:  This document was prepared using Dragon voice recognition software and may include unintentional dictation errors.   Chesley Noon, MD 08/03/22 1228

## 2022-08-03 NOTE — ED Provider Triage Note (Signed)
Emergency Medicine Provider Triage Evaluation Note  Johnny Russell , a 46 y.o. male  was evaluated in triage.  Pt complains of right-sided flank pain.  History of kidney stones.  Review of Systems  Positive:  Negative:   Physical Exam  BP (!) 151/90 (BP Location: Left Arm)   Pulse 85   Temp 98.4 F (36.9 C) (Oral)   Resp 18   Ht 5\' 10"  (1.778 m)   Wt 113.4 kg   SpO2 97%   BMI 35.87 kg/m  Gen:   Awake, no distress   Resp:  Normal effort  MSK:   Moves extremities without difficulty  Other:    Medical Decision Making  Medically screening exam initiated at 8:53 AM.  Appropriate orders placed.  Laine was informed that the remainder of the evaluation will be completed by another provider, this initial triage assessment does not replace that evaluation, and the importance of remaining in the ED until their evaluation is complete.     Gifford Shave, PA-C 08/03/22 [phone]

## 2022-08-04 LAB — URINE CULTURE: Culture: NO GROWTH

## 2023-09-11 ENCOUNTER — Other Ambulatory Visit: Payer: Self-pay

## 2023-09-11 ENCOUNTER — Emergency Department
Admission: EM | Admit: 2023-09-11 | Discharge: 2023-09-11 | Disposition: A | Discharge status: Home / Self Care | Payer: Medicaid Other | Attending: Emergency Medicine | Admitting: Emergency Medicine

## 2023-09-11 DIAGNOSIS — S39012A Strain of muscle, fascia and tendon of lower back, initial encounter: Secondary | ICD-10-CM

## 2023-09-11 DIAGNOSIS — X500XXA Overexertion from strenuous movement or load, initial encounter: Secondary | ICD-10-CM | POA: Insufficient documentation

## 2023-09-11 DIAGNOSIS — S3992XA Unspecified injury of lower back, initial encounter: Secondary | ICD-10-CM | POA: Diagnosis present

## 2023-09-11 MED ORDER — CYCLOBENZAPRINE HCL 10 MG PO TABS: 10.0000 mg | ORAL_TABLET | Freq: Three times a day (TID) | ORAL | 0 refills | Status: AC | PRN

## 2023-09-11 MED ORDER — PREDNISONE 20 MG PO TABS: 60.0000 mg | ORAL_TABLET | Freq: Every day | ORAL | 0 refills | Status: AC

## 2023-09-11 MED ORDER — PREDNISONE 20 MG PO TABS
60.0000 mg | ORAL_TABLET | Freq: Once | ORAL | Status: AC
  Administered 2023-09-11: 60 mg via ORAL
  Filled 2023-09-11: qty 3

## 2023-09-11 NOTE — ED Provider Triage Note (Signed)
 Emergency Medicine Provider Triage Evaluation Note  Johnny Russell , a 48 y.o. male  was evaluated in triage.  Pt complains of back pain that began 2 weeks ago. Pain got worse today because he moved a bunch of furniture.   Review of Systems  Positive: Back pain Negative: Tingling, numbness, radiation down the legs, incontinence  Physical Exam  There were no vitals taken for this visit. Gen:   Awake, no distress   Resp:  Normal effort  MSK:   Moves extremities without difficulty  Other:    Medical Decision Making  Medically screening exam initiated at 1:38 PM.  Appropriate orders placed.  Johnny Russell was informed that the remainder of the evaluation will be completed by another provider, this initial triage assessment does not replace that evaluation, and the importance of remaining in the ED until their evaluation is complete.    Cleaster Tinnie LABOR, PA-C 09/11/23 1340

## 2023-09-11 NOTE — ED Triage Notes (Signed)
 Pt states lower back that has been ongoing for 2 weeks. Pt denies new injury but has been working a lot. NAD noted.

## 2023-09-11 NOTE — Discharge Instructions (Signed)
 Continue taking the Aleve  up to 500 mg twice daily.  Take the prednisone  daily for another 4 days starting tomorrow.  You may use the Flexeril  up to 3 times daily as needed.  Return to the ER for new, worsening, or persistent severe pain, weakness or numbness in the legs, difficulty walking, or any other new or worsening symptoms that concern you.

## 2023-09-11 NOTE — ED Provider Notes (Signed)
 [facility] Provider Note    Event Date/Time   First MD Initiated Contact with Patient 09/11/23 1426     (approximate)   History   Back Pain   HPI  Johnny Russell is a 48 y.o. male with a history of kidney stones who is with back pain for the last several days, acute onset, in the mid and lower part of his back on the left side.  The patient states that he initially pulled something there a few weeks ago and had some pain.  It had resolved, but then a few days ago at work he was lifting heavy objects and suddenly the pain flared up again.  The pain does not radiate.  He has no associated pain going down the leg.  He has no numbness or weakness.  He denies any urinary retention or incontinence.  He denies any history of falls or direct trauma to the back.   Physical Exam   Triage Vital Signs: ED Triage Vitals  Encounter Vitals Group     BP 09/11/23 1342 (!) 154/95     Systolic BP Percentile --      Diastolic BP Percentile --      Pulse Rate 09/11/23 1342 88     Resp 09/11/23 1342 19     Temp 09/11/23 1342 98 F (36.7 C)     Temp Source 09/11/23 1342 Oral     SpO2 09/11/23 1342 100 %     Weight --      Height --      Head Circumference --      Peak Flow --      Pain Score 09/11/23 1343 9     Pain Loc --      Pain Education --      Exclude from Growth Chart --     Most recent vital signs: Vitals:   09/11/23 1342  BP: (!) 154/95  Pulse: 88  Resp: 19  Temp: 98 F (36.7 C)  SpO2: 100%     General: Awake, no distress.  CV:  Good peripheral perfusion.  Resp:  Normal effort.  Abd:  No distention.  Other:  Mild left paraspinal thoracolumbar muscle tenderness.  No midline spinal tenderness.  5/5 motor strength and intact sensation of bilateral lower extremities.  Normal gait.   ED Results / Procedures / Treatments   Labs (all labs ordered are listed, but only abnormal results are displayed) Labs Reviewed - No data to  display   EKG   RADIOLOGY   PROCEDURES:  Critical Care performed: No  Procedures   MEDICATIONS ORDERED IN ED: Medications  predniSONE  (DELTASONE ) tablet 60 mg (60 mg Oral Given 09/11/23 1454)     IMPRESSION / MDM / ASSESSMENT AND PLAN / ED COURSE  I reviewed the triage vital signs and the nursing notes.  48 year old male with PMH as noted above presents with left thoracolumbar atraumatic back pain exacerbated after lifting heavy objects at work.  Physical exam is unremarkable.  The patient has no red flag symptoms.  Differential diagnosis includes, but is not limited to, radiculopathy, muscle strain or spasm.  There is no indication for imaging given the lack of trauma.  The patient has no neurologic deficits.  He is already taking Naprosyn ; I will prescribe a steroid and muscle relaxant.  Patient's presentation is most consistent with acute, uncomplicated illness.  At this time, the patient is stable for discharge home.  Return precautions given, and he expresses  understanding.   FINAL CLINICAL IMPRESSION(S) / ED DIAGNOSES   Final diagnoses:  Strain of lumbar region, initial encounter     Rx / DC Orders   ED Discharge Orders          Ordered    predniSONE  (DELTASONE ) 20 MG tablet  Daily        09/11/23 1444    cyclobenzaprine  (FLEXERIL ) 10 MG tablet  3 times daily PRN        09/11/23 1444             Note:  This document was prepared using Dragon voice recognition software and may include unintentional dictation errors.    Jacolyn Pae, MD 09/11/23 1550

## 2023-09-16 ENCOUNTER — Emergency Department: Payer: Medicaid Other

## 2023-09-16 ENCOUNTER — Other Ambulatory Visit: Payer: Self-pay

## 2023-09-16 DIAGNOSIS — X58XXXA Exposure to other specified factors, initial encounter: Secondary | ICD-10-CM | POA: Diagnosis not present

## 2023-09-16 DIAGNOSIS — M545 Low back pain, unspecified: Secondary | ICD-10-CM | POA: Diagnosis present

## 2023-09-16 DIAGNOSIS — S39012A Strain of muscle, fascia and tendon of lower back, initial encounter: Secondary | ICD-10-CM | POA: Diagnosis not present

## 2023-09-16 LAB — BASIC METABOLIC PANEL
Anion gap: 11 (ref 5–15)
BUN: 12 mg/dL (ref 6–20)
CO2: 25 mmol/L (ref 22–32)
Calcium: 9.1 mg/dL (ref 8.9–10.3)
Chloride: 101 mmol/L (ref 98–111)
Creatinine, Ser: 0.98 mg/dL (ref 0.61–1.24)
GFR, Estimated: >60 mL/min (ref >60)
Glucose, Bld: 110 mg/dL — ABNORMAL HIGH (ref 70–99)
Potassium: 4.2 mmol/L (ref 3.5–5.1)
Sodium: 137 mmol/L (ref 135–145)

## 2023-09-16 LAB — CBC WITH DIFFERENTIAL/PLATELET
Abs Immature Granulocytes: 0.18 10*3/uL — ABNORMAL HIGH (ref 0.00–0.07)
Basophils Absolute: 0.1 10*3/uL (ref 0.0–0.1)
Basophils Relative: 0 %
Eosinophils Absolute: 0 10*3/uL (ref 0.0–0.5)
Eosinophils Relative: 0 %
HCT: 50 % (ref 39.0–52.0)
Hemoglobin: 17.6 g/dL — ABNORMAL HIGH (ref 13.0–17.0)
Immature Granulocytes: 2 %
Lymphocytes Relative: 22 %
Lymphs Abs: 2.6 10*3/uL (ref 0.7–4.0)
MCH: 33 pg (ref 26.0–34.0)
MCHC: 35.2 g/dL (ref 30.0–36.0)
MCV: 93.6 fL (ref 80.0–100.0)
Monocytes Absolute: 0.6 10*3/uL (ref 0.1–1.0)
Monocytes Relative: 5 %
Neutro Abs: 8.5 10*3/uL — ABNORMAL HIGH (ref 1.7–7.7)
Neutrophils Relative %: 71 %
Platelets: 263 10*3/uL (ref 150–400)
RBC: 5.34 MIL/uL (ref 4.22–5.81)
RDW: 11.9 % (ref 11.5–15.5)
WBC: 12 10*3/uL — ABNORMAL HIGH (ref 4.0–10.5)
nRBC: 0 % (ref 0.0–0.2)

## 2023-09-16 NOTE — ED Triage Notes (Signed)
 Pt reports he was seen here 4 days ago for same lower back pain, worse with movement. Pt states pain has made it hard to move. Pt states he was given prednisone and muscle relaxer with no relief. Pt denies dysuria.

## 2023-09-17 ENCOUNTER — Emergency Department
Admission: EM | Admit: 2023-09-17 | Discharge: 2023-09-17 | Disposition: A | Discharge status: Home / Self Care | Payer: Medicaid Other | Attending: Emergency Medicine | Admitting: Emergency Medicine

## 2023-09-17 DIAGNOSIS — S39012A Strain of muscle, fascia and tendon of lower back, initial encounter: Secondary | ICD-10-CM

## 2023-09-17 MED ORDER — OXYCODONE HCL 5 MG PO TABS: 5.0000 mg | ORAL_TABLET | Freq: Three times a day (TID) | ORAL | 0 refills | Status: DC | PRN

## 2023-09-17 MED ORDER — OXYCODONE HCL 5 MG PO TABS
5.0000 mg | ORAL_TABLET | Freq: Once | ORAL | Status: AC
  Administered 2023-09-17: 5 mg via ORAL
  Filled 2023-09-17: qty 1

## 2023-09-17 MED ORDER — ACETAMINOPHEN 500 MG PO TABS
1000.0000 mg | ORAL_TABLET | Freq: Once | ORAL | Status: AC
  Administered 2023-09-17: 1000 mg via ORAL
  Filled 2023-09-17: qty 2

## 2023-09-17 MED ORDER — KETOROLAC TROMETHAMINE 30 MG/ML IJ SOLN
30.0000 mg | Freq: Once | INTRAMUSCULAR | Status: AC
  Administered 2023-09-17: 30 mg via INTRAMUSCULAR
  Filled 2023-09-17: qty 1

## 2023-09-17 NOTE — ED Provider Notes (Signed)
 [facility] Provider Note    Event Date/Time   First MD Initiated Contact with Patient 09/17/23 0104     (approximate)   History   Back Pain   HPI  Johnny Russell is a 48 y.o. male   Past medical history of history of kidney stones who presents emergency department with right sided lumbar pain.  Started approximately 1 week ago and was seen in the emergency department diagnosed with strain of the lumbar region and discharged with muscle relaxant.  Pain was be exacerbated while getting up from seated position today.  Right lower back pain with occasional radiation to the groin area..  Does not feel similar to kidney stones in the past.  No urinary symptoms.  No testicular pain.  He denies any motor or sensory changes.  No incontinence.  Is able to walk.   External Medical Documents Reviewed: Emergency department visit dated 09/11/2023 for lumbar strain      Physical Exam   Triage Vital Signs: ED Triage Vitals  Encounter Vitals Group     BP 09/16/23 2253 (!) 149/95     Systolic BP Percentile --      Diastolic BP Percentile --      Pulse Rate 09/16/23 2253 (!) 119     Resp 09/16/23 2253 18     Temp 09/16/23 2253 98.4 F (36.9 C)     Temp Source 09/16/23 2253 Oral     SpO2 09/16/23 2253 100 %     Weight 09/16/23 2252 240 lb (108.9 kg)     Height 09/16/23 2252 5\' 10"  (1.778 m)     Head Circumference --      Peak Flow --      Pain Score 09/16/23 2252 10     Pain Loc --      Pain Education --      Exclude from Growth Chart --     Most recent vital signs: Vitals:   09/16/23 2253 09/17/23 0150  BP: (!) 149/95 133/77  Pulse: (!) 119 78  Resp: 18 18  Temp: 98.4 F (36.9 C) 98.1 F (36.7 C)  SpO2: 100% 98%    General: Awake, no distress.  CV:  Good peripheral perfusion.  Resp:  Normal effort.  Abd:  No distention.  Other:  Awake alert comfortable appearing with normal hemodynamics.  Right paraspinal tenderness to palpation.  Motor  sensor intact.  Able to ambulate.  Soft benign abdominal exam deep palpation all quadrants.  There is no midline tenderness step-off or deformity   ED Results / Procedures / Treatments   Labs (all labs ordered are listed, but only abnormal results are displayed) Labs Reviewed  CBC WITH DIFFERENTIAL/PLATELET - Abnormal; Notable for the following components:      Result Value   WBC 12.0 (*)    Hemoglobin 17.6 (*)    Neutro Abs 8.5 (*)    Abs Immature Granulocytes 0.18 (*)    All other components within normal limits  BASIC METABOLIC PANEL - Abnormal; Notable for the following components:   Glucose, Bld 110 (*)    All other components within normal limits  URINALYSIS, ROUTINE W REFLEX MICROSCOPIC     I ordered and reviewed the above labs they are notable for mild leukocytosis otherwise basic labs including electrolytes and cell counts within normal limits.  RADIOLOGY I independently reviewed and interpreted CT of the lumbar spine see no obvious fractures I also reviewed radiologist's formal read.   PROCEDURES:  Critical Care performed: No  Procedures   MEDICATIONS ORDERED IN ED: Medications  ketorolac  (TORADOL ) 30 MG/ML injection 30 mg (30 mg Intramuscular Given 09/17/23 0141)  oxyCODONE  (Oxy IR/ROXICODONE ) immediate release tablet 5 mg (5 mg Oral Given 09/17/23 0140)  acetaminophen  (TYLENOL ) tablet 1,000 mg (1,000 mg Oral Given 09/17/23 0140)    IMPRESSION / MDM / ASSESSMENT AND PLAN / ED COURSE  I reviewed the triage vital signs and the nursing notes.                                Patient's presentation is most consistent with acute presentation with potential threat to life or bodily function.  Differential diagnosis includes, but is not limited to, lumbar strain, lumbar sciatica, lumbar radiculopathy, fracture dislocation, cord compression, kidney stone, testicular torsion   The patient is on the cardiac monitor to evaluate for evidence of arrhythmia and/or  significant heart rate changes.  MDM:    Most likely lumbar strain with sciatica.  No midline tenderness and no direct trauma doubt fracture dislocation especially in light of normal CT scan.  History of kidney stones and this pain radiates to the groin suspicious for the same, renal CT scan shows no stones however.  He has no urinary symptoms we will defer urinalysis.  I radiates to his groin but he denies any testicular pain or swelling and defers testicular exam, doubt testicular torsion.  No red flag symptoms to suggest cord compression as there are no motor or sensory deficits nor incontinence.  Plan was for medical management, referral to spinal clinic should symptoms not improve with conservative management.  Discharge.      FINAL CLINICAL IMPRESSION(S) / ED DIAGNOSES   Final diagnoses:  Strain of lumbar region, initial encounter     Rx / DC Orders   ED Discharge Orders          Ordered    oxyCODONE  (ROXICODONE ) 5 MG immediate release tablet  Every 8 hours PRN        09/17/23 0134             Note:  This document was prepared using Dragon voice recognition software and may include unintentional dictation errors.    Buell Carmin, MD 09/17/23 [phone]

## 2023-09-17 NOTE — Discharge Instructions (Signed)
 Take acetaminophen  650 mg and ibuprofen  400 mg every 6 hours for pain.  Take with food.  Continue to take muscle relaxant.  I prescribed you oxycodone  to take for severe pain.  Thank you for choosing us  for your health care today!  Please see your primary doctor this week for a follow up appointment.   If you have any new, worsening, or unexpected symptoms call your doctor right away or come back to the emergency department for reevaluation.  It was my pleasure to care for you today.   Arron Large Margery Sheets, MD

## 2023-12-07 ENCOUNTER — Emergency Department
Admission: EM | Admit: 2023-12-07 | Discharge: 2023-12-07 | Disposition: A | Discharge status: Home / Self Care | Attending: Emergency Medicine | Admitting: Emergency Medicine

## 2023-12-07 ENCOUNTER — Emergency Department

## 2023-12-07 ENCOUNTER — Other Ambulatory Visit: Payer: Self-pay

## 2023-12-07 DIAGNOSIS — R251 Tremor, unspecified: Secondary | ICD-10-CM | POA: Diagnosis not present

## 2023-12-07 DIAGNOSIS — F17200 Nicotine dependence, unspecified, uncomplicated: Secondary | ICD-10-CM | POA: Insufficient documentation

## 2023-12-07 DIAGNOSIS — R079 Chest pain, unspecified: Secondary | ICD-10-CM | POA: Insufficient documentation

## 2023-12-07 DIAGNOSIS — I10 Essential (primary) hypertension: Secondary | ICD-10-CM | POA: Diagnosis not present

## 2023-12-07 DIAGNOSIS — R030 Elevated blood-pressure reading, without diagnosis of hypertension: Secondary | ICD-10-CM

## 2023-12-07 LAB — CBC
HCT: 48.7 % (ref 39.0–52.0)
Hemoglobin: 17 g/dL (ref 13.0–17.0)
MCH: 32.5 pg (ref 26.0–34.0)
MCHC: 34.9 g/dL (ref 30.0–36.0)
MCV: 93.1 fL (ref 80.0–100.0)
Platelets: 236 10*3/uL (ref 150–400)
RBC: 5.23 MIL/uL (ref 4.22–5.81)
RDW: 13.5 % (ref 11.5–15.5)
WBC: 8.3 10*3/uL (ref 4.0–10.5)
nRBC: 0 % (ref 0.0–0.2)

## 2023-12-07 LAB — HEPATIC FUNCTION PANEL
ALT: 31 U/L (ref 0–44)
AST: 37 U/L (ref 15–41)
Albumin: 4.3 g/dL (ref 3.5–5.0)
Alkaline Phosphatase: 98 U/L (ref 38–126)
Bilirubin, Direct: 0.1 mg/dL (ref 0.0–0.2)
Indirect Bilirubin: 0.5 mg/dL (ref 0.3–0.9)
Total Bilirubin: 0.6 mg/dL (ref 0.0–1.2)
Total Protein: 7.5 g/dL (ref 6.5–8.1)

## 2023-12-07 LAB — TROPONIN I (HIGH SENSITIVITY)
Troponin I (High Sensitivity): 5 ng/L (ref <18)
Troponin I (High Sensitivity): 6 ng/L (ref <18)

## 2023-12-07 LAB — BASIC METABOLIC PANEL WITH GFR
Anion gap: 10 (ref 5–15)
BUN: 6 mg/dL (ref 6–20)
CO2: 25 mmol/L (ref 22–32)
Calcium: 9.2 mg/dL (ref 8.9–10.3)
Chloride: 102 mmol/L (ref 98–111)
Creatinine, Ser: 0.96 mg/dL (ref 0.61–1.24)
GFR, Estimated: >60 mL/min (ref >60)
Glucose, Bld: 99 mg/dL (ref 70–99)
Potassium: 3.8 mmol/L (ref 3.5–5.1)
Sodium: 137 mmol/L (ref 135–145)

## 2023-12-07 LAB — LIPASE, BLOOD: Lipase: 30 U/L (ref 11–51)

## 2023-12-07 MED ORDER — LORAZEPAM 2 MG/ML IJ SOLN
1.0000 mg | Freq: Once | INTRAMUSCULAR | Status: AC
  Administered 2023-12-07: 1 mg via INTRAVENOUS
  Filled 2023-12-07: qty 1

## 2023-12-07 NOTE — ED Provider Notes (Signed)
 [facility] Provider Note    Event Date/Time   First MD Initiated Contact with Patient 12/07/23 1258     (approximate)   History   Chest Pain   HPI  Johnny Russell is a 48 y.o. male who does not have a primary care doctor but has a history of hypertension not on medications, smoking, alcohol use who comes in with concern for chest pain.  Patient reports intermittent stabbing chest pain on the left side of his chest that started just prior to arrival.  Patient reports feeling very anxious about it and he has some troubling noted that he states started when the chest pain started.  Patient does report drinking daily but states it has been only 2 beers.  When asked about the potential withdrawal he states that it would be possible since he did drink last night.  He does report that he just recently got all of his teeth pulled.  So has not been able to eat and drink less than normal.  Patient denies any shortness of breath, no leg swelling.  He reports that the pain comes on for about 15 seconds and goes away multiple times. Denies any risk factors for PE    Physical Exam   Triage Vital Signs: ED Triage Vitals  Encounter Vitals Group     BP 12/07/23 1258 (!) 151/98     Systolic BP Percentile --      Diastolic BP Percentile --      Pulse Rate 12/07/23 1258 87     Resp 12/07/23 1258 19     Temp 12/07/23 1258 98 F (36.7 C)     Temp src --      SpO2 12/07/23 1258 100 %     Weight 12/07/23 1256 250 lb (113.4 kg)     Height 12/07/23 1256 5\' 10"  (1.778 m)     Head Circumference --      Peak Flow --      Pain Score 12/07/23 1256 8     Pain Loc --      Pain Education --      Exclude from Growth Chart --     Most recent vital signs: Vitals:   12/07/23 1258  BP: (!) 151/98  Pulse: 87  Resp: 19  Temp: 98 F (36.7 C)  SpO2: 100%     General: Awake, no distress.  CV:  Good peripheral perfusion.  Resp:  Normal effort.  Abd:  No distention.   Soft and nontender Other:  Slight tremor noted throughout to both his extremities, tongue   ED Results / Procedures / Treatments   Labs (all labs ordered are listed, but only abnormal results are displayed) Labs Reviewed  BASIC METABOLIC PANEL WITH GFR  CBC  HEPATIC FUNCTION PANEL  LIPASE, BLOOD  TROPONIN I (HIGH SENSITIVITY)  TROPONIN I (HIGH SENSITIVITY)     EKG  My interpretation of EKG:  Normal sinus rate of 93 without any ST elevation or T wave inversions does have a lot of artifact but is being read as atrial flutter but this appears to be normal sinus will get a repeat EKG    RADIOLOGY I have reviewed the xray personally and interpreted no evidence of any pneumonia   PROCEDURES:  Critical Care performed: No  .1-3 Lead EKG Interpretation  Performed by: Concha Se, MD Authorized by: Concha Se, MD     Interpretation: normal     ECG rate:  80  ECG rate assessment: normal     Rhythm: sinus rhythm     Ectopy: none     Conduction: normal      MEDICATIONS ORDERED IN ED: Medications  LORazepam (ATIVAN) injection 1 mg (1 mg Intravenous Given 12/07/23 1343)     IMPRESSION / MDM / ASSESSMENT AND PLAN / ED COURSE  I reviewed the triage vital signs and the nursing notes.   Patient's presentation is most consistent with acute presentation with potential threat to life or bodily function.      INITIAL IMPRESSION / ASSESSMENT AND PLAN / ED COURSE   Most Likely DDx:  -Consider ACS vs [facility]K Vs Genella Rife- will get EKG/troponin to evaluate for ACS -Patient does look a little bit more calm when I went back into the room after the x-ray by will give 1 mg of Ativan in case he could have some mild withdrawal and or some anxiety related to this.  Will get cardiac markers to evaluate for ACS.  On repeat evaluation he denied any chest pain currently it comes and goes.   DDx that was also considered d/t potential to cause harm, but was found less likely based on history  and physical (as detailed above): -PNA (no fevers, cough but CXR to evaluate) -PNX (reassured with equal b/l breath sounds, CXR to evaluate) -Symptomatic anemia (will get H&H) -Pulmonary embolism as no sob at rest, not pleuritic in nature, no hypoxia PERC negative  did have o2 charted of 94% but pt was sleepy after ativan. -Aortic Dissection as no tearing pain and no radiation to the mid back, pulses equal, no murmur -Pericarditis no EKG changes or hx to suggest dx -Tamponade (no notable SOB, tachycardic, hypotensive) -Esophageal rupture (no h/o diffuse vomitting/no crepitus)     At this time given re-assuring workup I have considered CT imaging to rule out PE/Dissection but given history and physical exam these seem less likely and potential harm from CT would outweigh the probability of finding PE or Dissection.   Discussed with patient that I can not predict future heart attacks and that cardiology can evaluate for need for further workup including stress test.  Explained to patient that if there is a change in symptoms, worsening symptoms, or any other concerns that they should return for repeat evaluation to have repeat EKG/troponin. They expressed understanding.    2:31 PM evaluated patient he is resting comfortably in bed.  He denies any additional chest pain since arriving to the emergency room.  Patient pending a second troponin and if negative patient can be discharged home.  We discussed blood pressure for his medications but he states that it is just elevated because he is stressed that he does not want to start blood pressure medications today will refer him to primary care doctor. Pt declines wanting to stop drinking alcohol.  When second troponin is negative patient will be discharged home ____________________________________________   Note:  This document was prepared using Dragon voice recognition software and may include unintentional dictation errors.   The patient is on  the cardiac monitor to evaluate for evidence of arrhythmia and/or significant heart rate changes.      FINAL CLINICAL IMPRESSION(S) / ED DIAGNOSES   Final diagnoses:  None     Rx / DC Orders   ED Discharge Orders     None        Note:  This document was prepared using Dragon voice recognition software and may include unintentional dictation errors.   Fuller Plan,  Alben Spittle, MD 12/07/23 1434

## 2023-12-07 NOTE — ED Notes (Signed)
 Pt sleeping.

## 2023-12-07 NOTE — Discharge Instructions (Addendum)
 Your blood pressure was elevated and you should have this rechecked with your primary care doctor and I have also referred you to cardiology for your chest pain.  You should return to the ER if develop worsening symptoms or any other concerns

## 2023-12-07 NOTE — ED Triage Notes (Signed)
 Pt comes with left sided cp that started at [employer]. Pt states sharp pain and it has had 4 episodes since. Pt states he is shaking but not sure what that is from.

## 2024-09-28 ENCOUNTER — Other Ambulatory Visit: Payer: Self-pay

## 2024-09-28 ENCOUNTER — Emergency Department
Admission: EM | Admit: 2024-09-28 | Discharge: 2024-09-28 | Disposition: A | Discharge status: Home / Self Care | Attending: Emergency Medicine | Admitting: Emergency Medicine

## 2024-09-28 DIAGNOSIS — M791 Myalgia, unspecified site: Secondary | ICD-10-CM | POA: Insufficient documentation

## 2024-09-28 DIAGNOSIS — G8929 Other chronic pain: Secondary | ICD-10-CM | POA: Insufficient documentation

## 2024-09-28 DIAGNOSIS — Z87891 Personal history of nicotine dependence: Secondary | ICD-10-CM | POA: Diagnosis not present

## 2024-09-28 DIAGNOSIS — M7918 Myalgia, other site: Secondary | ICD-10-CM

## 2024-09-28 DIAGNOSIS — Z87442 Personal history of urinary calculi: Secondary | ICD-10-CM | POA: Insufficient documentation

## 2024-09-28 DIAGNOSIS — M549 Dorsalgia, unspecified: Secondary | ICD-10-CM | POA: Diagnosis present

## 2024-09-28 MED ORDER — KETOROLAC TROMETHAMINE 15 MG/ML IJ SOLN
15.0000 mg | Freq: Once | INTRAMUSCULAR | Status: AC
  Administered 2024-09-28: 15 mg via INTRAMUSCULAR

## 2024-09-28 MED ORDER — KETOROLAC TROMETHAMINE 15 MG/ML IJ SOLN
15.0000 mg | Freq: Once | INTRAMUSCULAR | Status: DC
  Filled 2024-09-28: qty 1

## 2024-09-28 MED ORDER — KETOROLAC TROMETHAMINE 15 MG/ML IJ SOLN: 15.0000 mg | Freq: Once | INTRAMUSCULAR | Status: DC

## 2024-09-28 NOTE — ED Provider Notes (Addendum)
 "  [facility] Provider Note    Event Date/Time   First MD Initiated Contact with Patient 09/28/24 [phone]     (approximate)   History   Back Pain   HPI  Johnny Russell is a 49 y.o. male   presents to the ED with complaint of exacerbation of his chronic back pain.  Patient has had back pain for approximately 2 years.  No recent injury at this time.  Patient denies any incontinence of bowel or bladder or urinary symptoms.  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  Currently patient has taken naproxen  once a day.  Patient has a history of kidney stones and smoking cigarettes.      Physical Exam   Triage Vital Signs: ED Triage Vitals [09/28/24 0850]  Encounter Vitals Group     BP (!) 146/99     Girls Systolic BP Percentile      Girls Diastolic BP Percentile      Boys Systolic BP Percentile      Boys Diastolic BP Percentile      Pulse Rate 94     Resp 17     Temp 98.4 F (36.9 C)     Temp Source Oral     SpO2 96 %     Weight 250 lb (113.4 kg)     Height 5' 10 (1.778 m)     Head Circumference      Peak Flow      Pain Score 10     Pain Loc      Pain Education      Exclude from Growth Chart     Most recent vital signs: Vitals:   09/28/24 0850 09/28/24 0936  BP: (!) 146/99   Pulse: 94   Resp: 17   Temp: 98.4 F (36.9 C)   SpO2: 96% 96%     General: Awake, no distress.  CV:  Good peripheral perfusion.  Resp:  Normal effort.  Abd:  No distention.  Other:  No point tenderness on palpation of the lumbar spine.  Left paravertebral muscles are tender with mildly restricted range of motion.  Good muscle strength bilaterally.  Patient is able to stand and ambulate without any assistance.  Normal gait was noted.   ED Results / Procedures / Treatments   Labs (all labs ordered are listed, but only abnormal results are displayed) Labs Reviewed - No data to  display    PROCEDURES:  Critical Care performed:   Procedures   MEDICATIONS ORDERED IN ED: Medications  ketorolac  (TORADOL ) 15 MG/ML injection 15 mg (15 mg Intramuscular Given 09/28/24 0917)     IMPRESSION / MDM / ASSESSMENT AND PLAN / ED COURSE  I reviewed the triage vital signs and the nursing notes.   Differential diagnosis includes, but is not limited to, chronic back pain for 2 years exacerbated by bending, musculoskeletal pain, musculoskeletal strain.  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  Patient reports that he has had back pain for approximately 2 years.  No recent injury.  Patient currently takes 2 naproxen  in the mornings but does not take any continued doses during the day.  Past medical records were reviewed.  Patient was given a Toradol  injection while in the ED.  We discussed increasing his naproxen  to 2 tablets twice a day which he states that he will begin.  He was also given a list of primary care clinics in the area so that he may establish a PCP.      Patient's presentation is most consistent with exacerbation of chronic illness.  FINAL CLINICAL IMPRESSION(S) / ED DIAGNOSES   Final diagnoses:  Chronic musculoskeletal pain     Rx / DC Orders   ED Discharge Orders     None        Note:  This document was prepared using Dragon voice recognition software and may include unintentional dictation errors.   Saunders Shona CROME, PA-C 09/28/24 1001    Saunders Shona CROME, PA-C 09/28/24 1003    Claudene Rover, MD 09/28/24 1500  "

## 2024-09-28 NOTE — Discharge Instructions (Signed)
 Follow-up with all the clinics listed on your discharge papers. Also begin taking naproxen  2 tablets twice a day to see if this alleviates your chronic back pain. Follow-up with one of clinics listed on your discharge papers.  Please go to the following website to schedule new (and existing) patient appointments:   [email or web address]   The following is a list of primary care offices in the area who are accepting new patients at this time.  Please reach out to one of them directly and let them know you would like to schedule an appointment to follow up on an Emergency Department visit, and/or to establish a new primary care provider (PCP).  There are likely other primary care clinics in the are who are accepting new patients, but this is an excellent place to start:  [facility] Lead physician: Dr Jon Eva [address] [place], [place] [address] [phone]  [facility] Lead Physician: Dr Dorette Loron [address], [place], [place] [address] [phone]  [facility]  Lead Physician: Dr Duwaine Louder [address] [place], [place] [address] [phone]  [facility] Lead Physician: Dr Marolyn Officer [address], [place], [place] [address] [phone]  [facility] at [facility] Lead Physician: Dr Leita Adie [address] [place], [place], [place] [address] [phone]

## 2024-09-28 NOTE — ED Triage Notes (Signed)
 Pt sts that he has been having back pain for the last year. Pt sts that it is just getting worse.

## 2024-10-13 ENCOUNTER — Ambulatory Visit: Admitting: General Practice
